# Patient Record
Sex: Female | Born: 1945
Health system: Southern US, Community
[De-identification: ages and names within clinical notes are randomized; demographics above are authoritative.]

## PROBLEM LIST (undated history)

## (undated) DIAGNOSIS — D649 Anemia, unspecified: Secondary | ICD-10-CM

## (undated) DIAGNOSIS — G4730 Sleep apnea, unspecified: Secondary | ICD-10-CM

## (undated) DIAGNOSIS — R062 Wheezing: Secondary | ICD-10-CM

## (undated) DIAGNOSIS — J449 Chronic obstructive pulmonary disease, unspecified: Secondary | ICD-10-CM

## (undated) DIAGNOSIS — I10 Essential (primary) hypertension: Secondary | ICD-10-CM

## (undated) DIAGNOSIS — E119 Type 2 diabetes mellitus without complications: Secondary | ICD-10-CM

## (undated) DIAGNOSIS — F32A Depression, unspecified: Secondary | ICD-10-CM

## (undated) DIAGNOSIS — K219 Gastro-esophageal reflux disease without esophagitis: Secondary | ICD-10-CM

## (undated) DIAGNOSIS — F329 Major depressive disorder, single episode, unspecified: Secondary | ICD-10-CM

## (undated) DIAGNOSIS — R0600 Dyspnea, unspecified: Secondary | ICD-10-CM

## (undated) DIAGNOSIS — R059 Cough, unspecified: Secondary | ICD-10-CM

## (undated) DIAGNOSIS — R05 Cough: Secondary | ICD-10-CM

## (undated) DIAGNOSIS — M1990 Unspecified osteoarthritis, unspecified site: Secondary | ICD-10-CM

## (undated) DIAGNOSIS — R011 Cardiac murmur, unspecified: Secondary | ICD-10-CM

## (undated) DIAGNOSIS — J45909 Unspecified asthma, uncomplicated: Secondary | ICD-10-CM

## (undated) HISTORY — PX: FRACTURE SURGERY: SHX138

## (undated) HISTORY — PX: ABDOMINAL HYSTERECTOMY: SHX81

---

## 1982-08-22 HISTORY — PX: BREAST BIOPSY: SHX20

## 2004-09-13 ENCOUNTER — Ambulatory Visit: Payer: Self-pay | Admitting: Internal Medicine

## 2005-11-14 ENCOUNTER — Ambulatory Visit: Payer: Self-pay | Admitting: Internal Medicine

## 2006-11-16 ENCOUNTER — Ambulatory Visit: Payer: Self-pay | Admitting: Internal Medicine

## 2007-01-30 ENCOUNTER — Ambulatory Visit: Payer: Self-pay | Admitting: Specialist

## 2007-04-04 ENCOUNTER — Ambulatory Visit: Payer: Self-pay | Admitting: Internal Medicine

## 2007-04-23 ENCOUNTER — Ambulatory Visit: Payer: Self-pay | Admitting: Internal Medicine

## 2007-12-11 ENCOUNTER — Ambulatory Visit: Payer: Self-pay | Admitting: Internal Medicine

## 2008-12-11 ENCOUNTER — Ambulatory Visit: Payer: Self-pay | Admitting: Internal Medicine

## 2009-11-16 ENCOUNTER — Ambulatory Visit: Payer: Self-pay | Admitting: Internal Medicine

## 2010-01-07 ENCOUNTER — Ambulatory Visit: Payer: Self-pay | Admitting: Internal Medicine

## 2010-05-28 ENCOUNTER — Emergency Department: Payer: Self-pay | Admitting: Emergency Medicine

## 2011-03-18 ENCOUNTER — Ambulatory Visit: Payer: Self-pay | Admitting: Internal Medicine

## 2012-03-19 ENCOUNTER — Ambulatory Visit: Payer: Self-pay | Admitting: Internal Medicine

## 2012-06-22 ENCOUNTER — Ambulatory Visit: Payer: Self-pay | Admitting: Internal Medicine

## 2012-07-12 ENCOUNTER — Ambulatory Visit: Payer: Self-pay | Admitting: Internal Medicine

## 2013-05-15 ENCOUNTER — Ambulatory Visit: Payer: Self-pay | Admitting: Orthopedic Surgery

## 2013-07-03 ENCOUNTER — Ambulatory Visit: Payer: Self-pay | Admitting: Internal Medicine

## 2014-07-25 ENCOUNTER — Ambulatory Visit: Payer: Self-pay | Admitting: Internal Medicine

## 2014-09-03 ENCOUNTER — Emergency Department: Payer: Self-pay | Admitting: Emergency Medicine

## 2014-09-03 LAB — CBC
HCT: 37.8 % (ref 35.0–47.0)
HGB: 12.1 g/dL (ref 12.0–16.0)
MCH: 28.1 pg (ref 26.0–34.0)
MCHC: 32 g/dL (ref 32.0–36.0)
MCV: 88 fL (ref 80–100)
Platelet: 228 10*3/uL (ref 150–440)
RBC: 4.31 10*6/uL (ref 3.80–5.20)
RDW: 14.6 % — ABNORMAL HIGH (ref 11.5–14.5)
WBC: 8.9 10*3/uL (ref 3.6–11.0)

## 2014-09-03 LAB — BASIC METABOLIC PANEL
Anion Gap: 9 (ref 7–16)
BUN: 19 mg/dL — ABNORMAL HIGH (ref 7–18)
CALCIUM: 8.9 mg/dL (ref 8.5–10.1)
CO2: 27 mmol/L (ref 21–32)
Chloride: 104 mmol/L (ref 98–107)
Creatinine: 0.98 mg/dL (ref 0.60–1.30)
EGFR (African American): >60
EGFR (Non-African Amer.): 60 — ABNORMAL LOW
Glucose: 200 mg/dL — ABNORMAL HIGH (ref 65–99)
Osmolality: 287 (ref 275–301)
Potassium: 4.3 mmol/L (ref 3.5–5.1)
Sodium: 140 mmol/L (ref 136–145)

## 2015-07-24 ENCOUNTER — Other Ambulatory Visit: Payer: Self-pay | Admitting: Internal Medicine

## 2015-07-24 DIAGNOSIS — Z1231 Encounter for screening mammogram for malignant neoplasm of breast: Secondary | ICD-10-CM

## 2015-07-31 ENCOUNTER — Other Ambulatory Visit: Payer: Self-pay | Admitting: Internal Medicine

## 2015-07-31 ENCOUNTER — Ambulatory Visit
Admission: RE | Admit: 2015-07-31 | Discharge: 2015-07-31 | Disposition: A | Discharge status: Home / Self Care | Payer: Medicare Other | Source: Ambulatory Visit | Attending: Internal Medicine | Admitting: Internal Medicine

## 2015-07-31 DIAGNOSIS — Z1231 Encounter for screening mammogram for malignant neoplasm of breast: Secondary | ICD-10-CM | POA: Diagnosis present

## 2016-08-05 ENCOUNTER — Other Ambulatory Visit: Payer: Self-pay | Admitting: Internal Medicine

## 2016-08-05 DIAGNOSIS — Z1231 Encounter for screening mammogram for malignant neoplasm of breast: Secondary | ICD-10-CM

## 2016-08-26 DIAGNOSIS — H2513 Age-related nuclear cataract, bilateral: Secondary | ICD-10-CM | POA: Diagnosis not present

## 2016-09-09 ENCOUNTER — Ambulatory Visit: Payer: No Typology Code available for payment source

## 2016-09-12 DIAGNOSIS — H2513 Age-related nuclear cataract, bilateral: Secondary | ICD-10-CM | POA: Diagnosis not present

## 2016-09-13 ENCOUNTER — Encounter: Payer: Self-pay | Admitting: *Deleted

## 2016-09-15 ENCOUNTER — Ambulatory Visit
Admission: RE | Admit: 2016-09-15 | Discharge: 2016-09-15 | Disposition: A | Discharge status: Home / Self Care | Payer: PPO | Source: Ambulatory Visit | Attending: Ophthalmology | Admitting: Ophthalmology

## 2016-09-15 ENCOUNTER — Encounter
Admission: RE | Disposition: A | Discharge status: Home / Self Care | Payer: Self-pay | Source: Ambulatory Visit | Attending: Ophthalmology

## 2016-09-15 ENCOUNTER — Ambulatory Visit: Payer: PPO | Admitting: Anesthesiology

## 2016-09-15 ENCOUNTER — Encounter: Payer: Self-pay | Admitting: *Deleted

## 2016-09-15 DIAGNOSIS — Z888 Allergy status to other drugs, medicaments and biological substances status: Secondary | ICD-10-CM | POA: Diagnosis not present

## 2016-09-15 DIAGNOSIS — K219 Gastro-esophageal reflux disease without esophagitis: Secondary | ICD-10-CM | POA: Diagnosis not present

## 2016-09-15 DIAGNOSIS — R062 Wheezing: Secondary | ICD-10-CM | POA: Diagnosis not present

## 2016-09-15 DIAGNOSIS — J449 Chronic obstructive pulmonary disease, unspecified: Secondary | ICD-10-CM | POA: Diagnosis not present

## 2016-09-15 DIAGNOSIS — G4730 Sleep apnea, unspecified: Secondary | ICD-10-CM | POA: Diagnosis not present

## 2016-09-15 DIAGNOSIS — H2512 Age-related nuclear cataract, left eye: Secondary | ICD-10-CM | POA: Diagnosis not present

## 2016-09-15 DIAGNOSIS — Z880 Allergy status to penicillin: Secondary | ICD-10-CM | POA: Diagnosis not present

## 2016-09-15 DIAGNOSIS — I10 Essential (primary) hypertension: Secondary | ICD-10-CM | POA: Insufficient documentation

## 2016-09-15 DIAGNOSIS — E7800 Pure hypercholesterolemia, unspecified: Secondary | ICD-10-CM | POA: Insufficient documentation

## 2016-09-15 DIAGNOSIS — H2513 Age-related nuclear cataract, bilateral: Secondary | ICD-10-CM | POA: Diagnosis not present

## 2016-09-15 DIAGNOSIS — E1136 Type 2 diabetes mellitus with diabetic cataract: Secondary | ICD-10-CM | POA: Diagnosis not present

## 2016-09-15 DIAGNOSIS — Z882 Allergy status to sulfonamides status: Secondary | ICD-10-CM | POA: Insufficient documentation

## 2016-09-15 DIAGNOSIS — Z90710 Acquired absence of both cervix and uterus: Secondary | ICD-10-CM | POA: Diagnosis not present

## 2016-09-15 DIAGNOSIS — R05 Cough: Secondary | ICD-10-CM | POA: Diagnosis not present

## 2016-09-15 DIAGNOSIS — R011 Cardiac murmur, unspecified: Secondary | ICD-10-CM | POA: Diagnosis not present

## 2016-09-15 HISTORY — DX: Type 2 diabetes mellitus without complications: E11.9

## 2016-09-15 HISTORY — DX: Essential (primary) hypertension: I10

## 2016-09-15 HISTORY — DX: Depression, unspecified: F32.A

## 2016-09-15 HISTORY — DX: Major depressive disorder, single episode, unspecified: F32.9

## 2016-09-15 HISTORY — DX: Chronic obstructive pulmonary disease, unspecified: J44.9

## 2016-09-15 HISTORY — DX: Anemia, unspecified: D64.9

## 2016-09-15 HISTORY — DX: Sleep apnea, unspecified: G47.30

## 2016-09-15 HISTORY — DX: Gastro-esophageal reflux disease without esophagitis: K21.9

## 2016-09-15 HISTORY — DX: Unspecified asthma, uncomplicated: J45.909

## 2016-09-15 HISTORY — PX: CATARACT EXTRACTION W/PHACO: SHX586

## 2016-09-15 HISTORY — DX: Cardiac murmur, unspecified: R01.1

## 2016-09-15 HISTORY — DX: Dyspnea, unspecified: R06.00

## 2016-09-15 HISTORY — DX: Unspecified osteoarthritis, unspecified site: M19.90

## 2016-09-15 LAB — GLUCOSE, CAPILLARY: GLUCOSE-CAPILLARY: 166 mg/dL — AB (ref 65–99)

## 2016-09-15 SURGERY — PHACOEMULSIFICATION, CATARACT, WITH IOL INSERTION
Anesthesia: Monitor Anesthesia Care | Site: Eye | Laterality: Left | Wound class: Clean

## 2016-09-15 MED ORDER — MOXIFLOXACIN HCL 0.5 % OP SOLN
OPHTHALMIC | Status: AC
  Filled 2016-09-15: qty 3

## 2016-09-15 MED ORDER — ARMC OPHTHALMIC DILATING DROPS
1.0000 "application " | OPHTHALMIC | Status: AC
  Administered 2016-09-15 (×2): 1 via OPHTHALMIC

## 2016-09-15 MED ORDER — SODIUM HYALURONATE 23 MG/ML IO SOLN
INTRAOCULAR | Status: AC
  Filled 2016-09-15: qty 0.6

## 2016-09-15 MED ORDER — SODIUM HYALURONATE 10 MG/ML IO SOLN
INTRAOCULAR | Status: DC | PRN
  Administered 2016-09-15: 0.85 mL via INTRAOCULAR

## 2016-09-15 MED ORDER — LIDOCAINE HCL (PF) 4 % IJ SOLN
INTRAMUSCULAR | Status: AC
  Filled 2016-09-15: qty 5

## 2016-09-15 MED ORDER — SODIUM HYALURONATE 10 MG/ML IO SOLN
INTRAOCULAR | Status: AC
  Filled 2016-09-15: qty 0.85

## 2016-09-15 MED ORDER — ARMC OPHTHALMIC DILATING DROPS
OPHTHALMIC | Status: AC
  Administered 2016-09-15: 06:00:00
  Filled 2016-09-15: qty 0.4

## 2016-09-15 MED ORDER — EPINEPHRINE PF 1 MG/ML IJ SOLN
INTRAMUSCULAR | Status: AC
  Filled 2016-09-15: qty 1

## 2016-09-15 MED ORDER — SODIUM HYALURONATE 23 MG/ML IO SOLN
INTRAOCULAR | Status: DC | PRN
  Administered 2016-09-15: 0.6 mL via INTRAOCULAR

## 2016-09-15 MED ORDER — FENTANYL CITRATE (PF) 100 MCG/2ML IJ SOLN
INTRAMUSCULAR | Status: AC
Start: 2016-09-15 — End: 2016-09-15
  Filled 2016-09-15: qty 2

## 2016-09-15 MED ORDER — SODIUM CHLORIDE 0.9 % IV SOLN
INTRAVENOUS | Status: DC
  Administered 2016-09-15: 07:00:00 via INTRAVENOUS

## 2016-09-15 MED ORDER — MOXIFLOXACIN HCL 0.5 % OP SOLN: 1.0000 [drp] | OPHTHALMIC | Status: DC | PRN

## 2016-09-15 MED ORDER — BSS IO SOLN
INTRAOCULAR | Status: DC | PRN
  Administered 2016-09-15: 200 mL via INTRAOCULAR

## 2016-09-15 MED ORDER — MOXIFLOXACIN HCL 0.5 % OP SOLN
OPHTHALMIC | Status: DC | PRN
  Administered 2016-09-15: 0.2 mL via OPHTHALMIC

## 2016-09-15 MED ORDER — POVIDONE-IODINE 5 % OP SOLN
OPHTHALMIC | Status: AC
  Filled 2016-09-15: qty 30

## 2016-09-15 MED ORDER — FENTANYL CITRATE (PF) 100 MCG/2ML IJ SOLN
INTRAMUSCULAR | Status: DC | PRN
  Administered 2016-09-15: 25 ug via INTRAVENOUS
  Administered 2016-09-15: 50 ug via INTRAVENOUS
  Administered 2016-09-15: 25 ug via INTRAVENOUS

## 2016-09-15 MED ORDER — LIDOCAINE HCL (PF) 4 % IJ SOLN
INTRAMUSCULAR | Status: DC | PRN
  Administered 2016-09-15: 4 mL via OPHTHALMIC

## 2016-09-15 SURGICAL SUPPLY — 20 items
CANNULA ANT/CHMB 27GA (MISCELLANEOUS) ×4 IMPLANT
CUP MEDICINE 2OZ PLAST GRAD ST (MISCELLANEOUS) ×2 IMPLANT
DISSECTOR HYDRO NUCLEUS 50X22 (MISCELLANEOUS) ×2 IMPLANT
GLOVE BIO SURGEON STRL SZ8 (GLOVE) ×2 IMPLANT
GLOVE BIOGEL M 6.5 STRL (GLOVE) ×2 IMPLANT
GLOVE SURG LX 7.5 STRW (GLOVE) ×1
GLOVE SURG LX STRL 7.5 STRW (GLOVE) ×1 IMPLANT
GOWN STRL REUS W/ TWL LRG LVL3 (GOWN DISPOSABLE) ×2 IMPLANT
GOWN STRL REUS W/TWL LRG LVL3 (GOWN DISPOSABLE) ×2
LENS IOL TECNIS ITEC 19.5 (Intraocular Lens) ×2 IMPLANT
PACK CATARACT (MISCELLANEOUS) ×2 IMPLANT
PACK CATARACT KING (MISCELLANEOUS) ×2 IMPLANT
PACK EYE AFTER SURG (MISCELLANEOUS) ×2 IMPLANT
SOL BSS BAG (MISCELLANEOUS) ×2
SOLUTION BSS BAG (MISCELLANEOUS) ×1 IMPLANT
SYR 3ML LL SCALE MARK (SYRINGE) ×4 IMPLANT
SYR 5ML LL (SYRINGE) ×2 IMPLANT
SYR TB 1ML 27GX1/2 LL (SYRINGE) ×2 IMPLANT
WATER STERILE IRR 250ML POUR (IV SOLUTION) ×2 IMPLANT
WIPE NON LINTING 3.25X3.25 (MISCELLANEOUS) ×2 IMPLANT

## 2016-09-15 NOTE — OR Nursing (Signed)
vigamox to OR with patient

## 2016-09-15 NOTE — OR Nursing (Signed)
Pt belongings taken to postop room 1

## 2016-09-15 NOTE — Anesthesia Post-op Follow-up Note (Cosign Needed)
Anesthesia QCDR form completed.        

## 2016-09-15 NOTE — Anesthesia Postprocedure Evaluation (Signed)
Anesthesia Post Note  Patient: Isabella White  Procedure(s) Performed: Procedure(s) (LRB): CATARACT EXTRACTION PHACO AND INTRAOCULAR LENS PLACEMENT (IOC) (Left)  Patient location during evaluation: Short Stay Anesthesia Type: MAC Level of consciousness: awake, awake and alert and oriented Pain management: pain level controlled Vital Signs Assessment: post-procedure vital signs reviewed and stable Respiratory status: spontaneous breathing Cardiovascular status: stable Postop Assessment: no headache Anesthetic complications: no     Last Vitals:  Vitals:   09/15/16 0810 09/15/16 0812  BP: (!) 128/93 (!) 128/93  Pulse: 72 78  Resp:  20  Temp: 36.9 C     Last Pain:  Vitals:   09/15/16 0603  TempSrc: Burnett Corrente

## 2016-09-15 NOTE — Discharge Instructions (Signed)
Eye Surgery Discharge Instructions  Expect mild scratchy sensation or mild soreness. DO NOT RUB YOUR EYE!  The day of surgery:  Minimal physical activity, but bed rest is not required  No reading, computer work, or close hand work  No bending, lifting, or straining.  May watch TV  For 24 hours:  No driving, legal decisions, or alcoholic beverages  Safety precautions  Eat anything you prefer: It is better to start with liquids, then soup then solid foods.  _____ Eye patch should be worn until postoperative exam tomorrow.  ____ Solar shield eyeglasses should be worn for comfort in the sunlight/patch while sleeping  Resume all regular medications including aspirin or Coumadin if these were discontinued prior to surgery. You may shower, bathe, shave, or wash your hair. Tylenol may be taken for mild discomfort.  Call your doctor if you experience significant pain, nausea, or vomiting, fever > 101 or other signs of infection. [phone] or [phone] Specific instructions:  Follow-up Information    Willey BladeBradley King, MD Follow up.   Specialty:  Ophthalmology Why:  January 26 at 10:00am Contact information: [address] [place] [address] [phone]

## 2016-09-15 NOTE — Op Note (Signed)
OPERATIVE NOTE  Isabella White [id number] 09/15/2016   PREOPERATIVE DIAGNOSIS:  Nuclear sclerotic cataract left eye.  H25.12   POSTOPERATIVE DIAGNOSIS:    Nuclear sclerotic cataract left eye.     PROCEDURE:  Phacoemusification with posterior chamber intraocular lens placement of the left eye   LENS:   Implant Name Type Inv. Item Serial No. Manufacturer Lot No. LRB No. Used  LENS IOL DIOP 19.5 - [id number] Intraocular Lens LENS IOL DIOP 19.5 [id number] AMO   Left 1       PCB00 +19.5   ULTRASOUND TIME: 0 minutes 48.9 seconds.  CDE 7.39   SURGEON:  Willey BladeBradley King, MD, MPH   ANESTHESIA:  Topical with tetracaine drops augmented with 1% preservative-free intracameral lidocaine.  ESTIMATED BLOOD LOSS: <1 mL   COMPLICATIONS:  None.   DESCRIPTION OF PROCEDURE:  The patient was identified in the holding room and transported to the operating room and placed in the supine position under the operating microscope.  The left eye was identified as the operative eye and it was prepped and draped in the usual sterile ophthalmic fashion.   A 1.0 millimeter clear-corneal paracentesis was made at the 5:00 position. 0.5 ml of preservative-free 1% lidocaine with epinephrine was injected into the anterior chamber.  The anterior chamber was filled with Healon 5 viscoelastic.  A 2.4 millimeter keratome was used to make a near-clear corneal incision at the 2:00 position.  A curvilinear capsulorrhexis was made with a cystotome and capsulorrhexis forceps.  Balanced salt solution was used to hydrodissect and hydrodelineate the nucleus.   Phacoemulsification was then used in stop and chop fashion to remove the lens nucleus and epinucleus.  The remaining cortex was then removed using the irrigation and aspiration handpiece. Healon was then placed into the capsular bag to distend it for lens placement.  A lens was then injected into the capsular bag.  The remaining viscoelastic was aspirated.   Wounds were  hydrated with balanced salt solution.  A small lens fragment presented from the subincisional paracentesis site, so the I/A was placed back into the eye to aspirate it.  The anterior chamber was inflated to a physiologic pressure with balanced salt solution.  Intracameral vigamox 0.1 mL undiltued was injected into the eye and a drop placed onto the ocular surface.  No wound leaks were noted.  The patient was taken to the recovery room in stable condition without complications of anesthesia or surgery  Willey BladeBradley King 09/15/2016, 8:09 AM

## 2016-09-15 NOTE — H&P (Signed)
The History and Physical notes are on paper, have been signed, and are to be scanned. The patient remains stable and unchanged from the H&P.   Previous H&P reviewed, patient examined, and there are no changes.  Isabella BladeBradley White 09/15/2016 7:29 AM

## 2016-09-15 NOTE — Anesthesia Preprocedure Evaluation (Signed)
Anesthesia Evaluation  Patient identified by MRN, date of birth, ID band Patient awake    Reviewed: Allergy & Precautions, H&P , NPO status , Patient's Chart, lab work & pertinent test results  History of Anesthesia Complications Negative for: history of anesthetic complications  Airway Mallampati: III  TM Distance: <3 FB Neck ROM: limited    Dental  (+) Poor Dentition, Chipped   Pulmonary shortness of breath and with exertion, asthma , sleep apnea , COPD,    Pulmonary exam normal breath sounds clear to auscultation       Cardiovascular Exercise Tolerance: Poor hypertension, (-) angina(-) Past MI and (-) DOE Normal cardiovascular exam+ Valvular Problems/Murmurs  Rhythm:regular Rate:Normal     Neuro/Psych PSYCHIATRIC DISORDERS negative neurological ROS     GI/Hepatic Neg liver ROS, GERD  Controlled,  Endo/Other  diabetes, Type 2  Renal/GU      Musculoskeletal  (+) Arthritis ,   Abdominal   Peds  Hematology negative hematology ROS (+)   Anesthesia Other Findings Past Medical History: No date: Anemia No date: Arthritis No date: Asthma No date: COPD (chronic obstructive pulmonary disease) (* No date: Depression No date: Diabetes mellitus without complication (HCC) No date: Dyspnea No date: GERD (gastroesophageal reflux disease) No date: Heart murmur No date: Hypertension No date: Sleep apnea     Comment: CPAP  Past Surgical History: No date: ABDOMINAL HYSTERECTOMY 1984: BREAST BIOPSY Right No date: FRACTURE SURGERY     Comment: ORIF ANKLE  BMI    Body Mass Index:  33.63 kg/m      Reproductive/Obstetrics negative OB ROS                             Anesthesia Physical Anesthesia Plan  ASA: III  Anesthesia Plan: MAC   Post-op Pain Management:    Induction:   Airway Management Planned:   Additional Equipment:   Intra-op Plan:   Post-operative Plan:   Informed  Consent: I have reviewed the patients History and Physical, chart, labs and discussed the procedure including the risks, benefits and alternatives for the proposed anesthesia with the patient or authorized representative who has indicated his/her understanding and acceptance.     Plan Discussed with: Anesthesiologist, CRNA and Surgeon  Anesthesia Plan Comments:         Anesthesia Quick Evaluation

## 2016-09-15 NOTE — Transfer of Care (Signed)
Immediate Anesthesia Transfer of Care Note  Patient: Isabella White  Procedure(s) Performed: Procedure(s) with comments: CATARACT EXTRACTION PHACO AND INTRAOCULAR LENS PLACEMENT (IOC) (Left) - Lot# [id number] H [place]: 00:48.9 AP%: 15.1 CDE: 7.39  Patient Location: PACU and Short Stay  Anesthesia Type:MAC  Level of Consciousness: awake, alert  and oriented  Airway & Oxygen Therapy: Patient Spontanous Breathing  Post-op Assessment: Report given to RN and Post -op Vital signs reviewed and stable  Post vital signs: Reviewed and stable  Last Vitals:  Vitals:   09/15/16 0603 09/15/16 0812  BP: (!) 149/64 (!) 128/93  Pulse: 73 78  Resp: 16 20  Temp: 36.7 C     Last Pain:  Vitals:   09/15/16 0603  TempSrc: Oral         Complications: No apparent anesthesia complications

## 2016-09-19 DIAGNOSIS — J4520 Mild intermittent asthma, uncomplicated: Secondary | ICD-10-CM | POA: Diagnosis not present

## 2016-09-19 DIAGNOSIS — E538 Deficiency of other specified B group vitamins: Secondary | ICD-10-CM | POA: Diagnosis not present

## 2016-09-19 DIAGNOSIS — G4733 Obstructive sleep apnea (adult) (pediatric): Secondary | ICD-10-CM | POA: Diagnosis not present

## 2016-09-29 DIAGNOSIS — E669 Obesity, unspecified: Secondary | ICD-10-CM | POA: Diagnosis not present

## 2016-09-29 DIAGNOSIS — E785 Hyperlipidemia, unspecified: Secondary | ICD-10-CM | POA: Diagnosis not present

## 2016-09-29 DIAGNOSIS — Z6833 Body mass index (BMI) 33.0-33.9, adult: Secondary | ICD-10-CM | POA: Diagnosis not present

## 2016-09-29 DIAGNOSIS — E538 Deficiency of other specified B group vitamins: Secondary | ICD-10-CM | POA: Diagnosis not present

## 2016-09-29 DIAGNOSIS — E1165 Type 2 diabetes mellitus with hyperglycemia: Secondary | ICD-10-CM | POA: Diagnosis not present

## 2016-10-06 ENCOUNTER — Other Ambulatory Visit: Payer: Self-pay | Admitting: Internal Medicine

## 2016-10-06 ENCOUNTER — Ambulatory Visit
Admission: RE | Admit: 2016-10-06 | Discharge: 2016-10-06 | Disposition: A | Discharge status: Home / Self Care | Payer: PPO | Source: Ambulatory Visit | Attending: Internal Medicine | Admitting: Internal Medicine

## 2016-10-06 DIAGNOSIS — Z1231 Encounter for screening mammogram for malignant neoplasm of breast: Secondary | ICD-10-CM

## 2016-10-21 DIAGNOSIS — E538 Deficiency of other specified B group vitamins: Secondary | ICD-10-CM | POA: Diagnosis not present

## 2016-11-01 DIAGNOSIS — H2511 Age-related nuclear cataract, right eye: Secondary | ICD-10-CM | POA: Diagnosis not present

## 2016-11-08 ENCOUNTER — Encounter: Payer: Self-pay | Admitting: *Deleted

## 2016-11-10 ENCOUNTER — Ambulatory Visit: Payer: PPO | Admitting: Anesthesiology

## 2016-11-10 ENCOUNTER — Ambulatory Visit
Admission: RE | Admit: 2016-11-10 | Discharge: 2016-11-10 | Disposition: A | Discharge status: Home / Self Care | Payer: PPO | Source: Ambulatory Visit | Attending: Ophthalmology | Admitting: Ophthalmology

## 2016-11-10 ENCOUNTER — Encounter: Payer: Self-pay | Admitting: *Deleted

## 2016-11-10 ENCOUNTER — Encounter
Admission: RE | Disposition: A | Discharge status: Home / Self Care | Payer: Self-pay | Source: Ambulatory Visit | Attending: Ophthalmology

## 2016-11-10 DIAGNOSIS — K219 Gastro-esophageal reflux disease without esophagitis: Secondary | ICD-10-CM | POA: Insufficient documentation

## 2016-11-10 DIAGNOSIS — F329 Major depressive disorder, single episode, unspecified: Secondary | ICD-10-CM | POA: Insufficient documentation

## 2016-11-10 DIAGNOSIS — J449 Chronic obstructive pulmonary disease, unspecified: Secondary | ICD-10-CM | POA: Diagnosis not present

## 2016-11-10 DIAGNOSIS — Z6833 Body mass index (BMI) 33.0-33.9, adult: Secondary | ICD-10-CM | POA: Insufficient documentation

## 2016-11-10 DIAGNOSIS — E669 Obesity, unspecified: Secondary | ICD-10-CM | POA: Diagnosis not present

## 2016-11-10 DIAGNOSIS — G4730 Sleep apnea, unspecified: Secondary | ICD-10-CM | POA: Insufficient documentation

## 2016-11-10 DIAGNOSIS — Z79899 Other long term (current) drug therapy: Secondary | ICD-10-CM | POA: Insufficient documentation

## 2016-11-10 DIAGNOSIS — D649 Anemia, unspecified: Secondary | ICD-10-CM | POA: Diagnosis not present

## 2016-11-10 DIAGNOSIS — E1136 Type 2 diabetes mellitus with diabetic cataract: Secondary | ICD-10-CM | POA: Diagnosis not present

## 2016-11-10 DIAGNOSIS — I10 Essential (primary) hypertension: Secondary | ICD-10-CM | POA: Insufficient documentation

## 2016-11-10 DIAGNOSIS — Z794 Long term (current) use of insulin: Secondary | ICD-10-CM | POA: Insufficient documentation

## 2016-11-10 DIAGNOSIS — M1990 Unspecified osteoarthritis, unspecified site: Secondary | ICD-10-CM | POA: Diagnosis not present

## 2016-11-10 DIAGNOSIS — H2511 Age-related nuclear cataract, right eye: Secondary | ICD-10-CM | POA: Diagnosis not present

## 2016-11-10 HISTORY — PX: CATARACT EXTRACTION W/PHACO: SHX586

## 2016-11-10 HISTORY — DX: Wheezing: R06.2

## 2016-11-10 HISTORY — DX: Cough: R05

## 2016-11-10 HISTORY — DX: Cough, unspecified: R05.9

## 2016-11-10 LAB — GLUCOSE, CAPILLARY: Glucose-Capillary: 200 mg/dL — ABNORMAL HIGH (ref 65–99)

## 2016-11-10 SURGERY — PHACOEMULSIFICATION, CATARACT, WITH IOL INSERTION
Anesthesia: Monitor Anesthesia Care | Site: Eye | Laterality: Right | Wound class: Clean

## 2016-11-10 MED ORDER — MOXIFLOXACIN HCL 0.5 % OP SOLN: 1.0000 [drp] | OPHTHALMIC | Status: DC | PRN

## 2016-11-10 MED ORDER — CARBACHOL 0.01 % IO SOLN
INTRAOCULAR | Status: DC | PRN
  Administered 2016-11-10: 0.5 mL via INTRAOCULAR

## 2016-11-10 MED ORDER — SODIUM HYALURONATE 10 MG/ML IO SOLN
INTRAOCULAR | Status: AC
  Filled 2016-11-10: qty 0.85

## 2016-11-10 MED ORDER — SODIUM HYALURONATE 23 MG/ML IO SOLN
INTRAOCULAR | Status: AC
  Filled 2016-11-10: qty 0.6

## 2016-11-10 MED ORDER — MOXIFLOXACIN HCL 0.5 % OP SOLN
OPHTHALMIC | Status: DC | PRN
  Administered 2016-11-10: 0.2 mL via OPHTHALMIC

## 2016-11-10 MED ORDER — MIDAZOLAM HCL 2 MG/2ML IJ SOLN
INTRAMUSCULAR | Status: AC
  Filled 2016-11-10: qty 2

## 2016-11-10 MED ORDER — EPINEPHRINE PF 1 MG/ML IJ SOLN
INTRAMUSCULAR | Status: AC
  Filled 2016-11-10: qty 2

## 2016-11-10 MED ORDER — MOXIFLOXACIN HCL 0.5 % OP SOLN
OPHTHALMIC | Status: AC
  Filled 2016-11-10: qty 3

## 2016-11-10 MED ORDER — POVIDONE-IODINE 5 % OP SOLN
OPHTHALMIC | Status: DC | PRN
  Administered 2016-11-10: 1 via OPHTHALMIC

## 2016-11-10 MED ORDER — POVIDONE-IODINE 5 % OP SOLN
OPHTHALMIC | Status: AC
  Filled 2016-11-10: qty 30

## 2016-11-10 MED ORDER — ARMC OPHTHALMIC DILATING DROPS
OPHTHALMIC | Status: AC
  Filled 2016-11-10: qty 0.4

## 2016-11-10 MED ORDER — LIDOCAINE HCL (PF) 4 % IJ SOLN
INTRAOCULAR | Status: DC | PRN
  Administered 2016-11-10: 4 mL via OPHTHALMIC

## 2016-11-10 MED ORDER — LIDOCAINE HCL (PF) 2 % IJ SOLN
INTRAMUSCULAR | Status: AC
  Filled 2016-11-10: qty 2

## 2016-11-10 MED ORDER — SODIUM HYALURONATE 23 MG/ML IO SOLN
INTRAOCULAR | Status: DC | PRN
  Administered 2016-11-10: 0.6 mL via INTRAOCULAR

## 2016-11-10 MED ORDER — MIDAZOLAM HCL 2 MG/2ML IJ SOLN
INTRAMUSCULAR | Status: DC | PRN
  Administered 2016-11-10 (×2): 0.5 mg via INTRAVENOUS

## 2016-11-10 MED ORDER — SODIUM HYALURONATE 10 MG/ML IO SOLN
INTRAOCULAR | Status: DC | PRN
  Administered 2016-11-10: 0.85 mL via INTRAOCULAR

## 2016-11-10 MED ORDER — SODIUM CHLORIDE 0.9 % IV SOLN
INTRAVENOUS | Status: DC
  Administered 2016-11-10 (×2): via INTRAVENOUS

## 2016-11-10 MED ORDER — ALFENTANIL 500 MCG/ML IJ INJ
INJECTION | INTRAMUSCULAR | Status: DC | PRN
  Administered 2016-11-10 (×2): 250 ug via INTRAVENOUS

## 2016-11-10 MED ORDER — ARMC OPHTHALMIC DILATING DROPS
1.0000 "application " | OPHTHALMIC | Status: AC
  Administered 2016-11-10 (×3): 1 via OPHTHALMIC

## 2016-11-10 MED ORDER — EPINEPHRINE PF 1 MG/ML IJ SOLN
INTRAOCULAR | Status: DC | PRN
  Administered 2016-11-10: 08:00:00 via OPHTHALMIC

## 2016-11-10 SURGICAL SUPPLY — 20 items
CANNULA ANT/CHMB 27GA (MISCELLANEOUS) ×4 IMPLANT
CUP MEDICINE 2OZ PLAST GRAD ST (MISCELLANEOUS) ×2 IMPLANT
DISSECTOR HYDRO NUCLEUS 50X22 (MISCELLANEOUS) ×2 IMPLANT
GLOVE BIO SURGEON STRL SZ8 (GLOVE) ×2 IMPLANT
GLOVE BIOGEL M 6.5 STRL (GLOVE) ×2 IMPLANT
GLOVE SURG LX 7.5 STRW (GLOVE) ×1
GLOVE SURG LX STRL 7.5 STRW (GLOVE) ×1 IMPLANT
GOWN STRL REUS W/ TWL LRG LVL3 (GOWN DISPOSABLE) ×2 IMPLANT
GOWN STRL REUS W/TWL LRG LVL3 (GOWN DISPOSABLE) ×2
LENS IOL TECNIS ITEC 19.5 (Intraocular Lens) ×2 IMPLANT
PACK CATARACT (MISCELLANEOUS) ×2 IMPLANT
PACK CATARACT KING (MISCELLANEOUS) ×2 IMPLANT
PACK EYE AFTER SURG (MISCELLANEOUS) ×2 IMPLANT
SOL BSS BAG (MISCELLANEOUS) ×2
SOLUTION BSS BAG (MISCELLANEOUS) ×1 IMPLANT
SYR 3ML LL SCALE MARK (SYRINGE) ×4 IMPLANT
SYR 5ML LL (SYRINGE) ×2 IMPLANT
SYR TB 1ML 27GX1/2 LL (SYRINGE) ×2 IMPLANT
WATER STERILE IRR 250ML POUR (IV SOLUTION) ×2 IMPLANT
WIPE NON LINTING 3.25X3.25 (MISCELLANEOUS) ×2 IMPLANT

## 2016-11-10 NOTE — H&P (Signed)
The History and Physical notes are on paper, have been signed, and are to be scanned.   I have examined the patient and there are no changes to the H&P.   Willey BladeBradley Kollyn Lingafelter 11/10/2016 8:15 AM

## 2016-11-10 NOTE — Anesthesia Postprocedure Evaluation (Signed)
Anesthesia Post Note  Patient: Isabella White  Procedure(s) Performed: Procedure(s) (LRB): CATARACT EXTRACTION PHACO AND INTRAOCULAR LENS PLACEMENT (IOC) (Right)  Patient location during evaluation: PACU Anesthesia Type: MAC Level of consciousness: awake and alert and oriented Pain management: pain level controlled Vital Signs Assessment: post-procedure vital signs reviewed and stable Respiratory status: spontaneous breathing, nonlabored ventilation and respiratory function stable Cardiovascular status: blood pressure returned to baseline and stable Postop Assessment: no signs of nausea or vomiting Anesthetic complications: no     Last Vitals:  Vitals:   11/10/16 0854 11/10/16 0856  BP: 123/68 123/68  Pulse: 73 72  Resp: 15 16  Temp: 36.9 C 36.9 C    Last Pain:  Vitals:   11/10/16 0655  TempSrc: Oral                 Ludy Messamore

## 2016-11-10 NOTE — Transfer of Care (Signed)
Immediate Anesthesia Transfer of Care Note  Patient: Fingal  Procedure(s) Performed: Procedure(s) with comments: CATARACT EXTRACTION PHACO AND INTRAOCULAR LENS PLACEMENT (IOC) (Right) - [place] 56.6 AP% 10.8 CDE 6.39 Fluid Pack lot # [id number]  Patient Location: PACU and Short Stay  Anesthesia Type:MAC  Level of Consciousness: awake, oriented and patient cooperative  Airway & Oxygen Therapy: Patient Spontanous Breathing  Post-op Assessment: Report given to RN and Post -op Vital signs reviewed and stable  Post vital signs: Reviewed and stable  Last Vitals:  Vitals:   11/10/16 0655  BP: (!) 166/82  Pulse: 88  Resp: 18  Temp: 36.7 C    Last Pain:  Vitals:   11/10/16 0655  TempSrc: Oral         Complications: No apparent anesthesia complications

## 2016-11-10 NOTE — Anesthesia Preprocedure Evaluation (Signed)
Anesthesia Evaluation  Patient identified by MRN, date of birth, ID band Patient awake    Reviewed: Allergy & Precautions, H&P , NPO status , Patient's Chart, lab work & pertinent test results  History of Anesthesia Complications Negative for: history of anesthetic complications  Airway Mallampati: III  TM Distance: <3 FB Neck ROM: limited    Dental  (+) Poor Dentition, Chipped   Pulmonary shortness of breath and with exertion, asthma , sleep apnea , COPD,    Pulmonary exam normal breath sounds clear to auscultation- rhonchi (-) wheezing      Cardiovascular Exercise Tolerance: Poor hypertension, Pt. on medications (-) angina(-) Past MI and (-) DOE Normal cardiovascular exam+ Valvular Problems/Murmurs  Rhythm:regular Rate:Normal - Systolic murmurs and - Diastolic murmurs    Neuro/Psych PSYCHIATRIC DISORDERS Depression negative neurological ROS     GI/Hepatic Neg liver ROS, GERD  Controlled,  Endo/Other  diabetes, Type 2, Insulin Dependent  Renal/GU negative Renal ROS     Musculoskeletal  (+) Arthritis ,   Abdominal (+) + obese,   Peds  Hematology  (+) anemia ,   Anesthesia Other Findings Past Medical History: No date: Anemia No date: Arthritis No date: Asthma No date: COPD (chronic obstructive pulmonary disease) (* No date: Depression No date: Diabetes mellitus without complication (HCC) No date: Dyspnea No date: GERD (gastroesophageal reflux disease) No date: Heart murmur No date: Hypertension No date: Sleep apnea     Comment: CPAP  Past Surgical History: No date: ABDOMINAL HYSTERECTOMY 1984: BREAST BIOPSY Right No date: FRACTURE SURGERY     Comment: ORIF ANKLE  BMI    Body Mass Index:  33.63 kg/m      Reproductive/Obstetrics negative OB ROS                             Anesthesia Physical  Anesthesia Plan  ASA: III  Anesthesia Plan: MAC   Post-op Pain Management:     Induction:   Airway Management Planned: Natural Airway  Additional Equipment:   Intra-op Plan:   Post-operative Plan:   Informed Consent: I have reviewed the patients History and Physical, chart, labs and discussed the procedure including the risks, benefits and alternatives for the proposed anesthesia with the patient or authorized representative who has indicated his/her understanding and acceptance.     Plan Discussed with: Anesthesiologist, CRNA and Surgeon  Anesthesia Plan Comments:         Anesthesia Quick Evaluation

## 2016-11-10 NOTE — Discharge Instructions (Signed)
Eye Surgery Discharge Instructions  Expect mild scratchy sensation or mild soreness. DO NOT RUB YOUR EYE!  The day of surgery:  Minimal physical activity, but bed rest is not required  No reading, computer work, or close hand work  No bending, lifting, or straining.  May watch TV  For 24 hours:  No driving, legal decisions, or alcoholic beverages  Safety precautions  Eat anything you prefer: It is better to start with liquids, then soup then solid foods.  _____ Eye patch should be worn until postoperative exam tomorrow.  ____ Solar shield eyeglasses should be worn for comfort in the sunlight/patch while sleeping  Resume all regular medications including aspirin or Coumadin if these were discontinued prior to surgery. You may shower, bathe, shave, or wash your hair. Tylenol may be taken for mild discomfort.  Call your doctor if you experience significant pain, nausea, or vomiting, fever > 101 or other signs of infection. [phone] or [phone] Specific instructions:  Follow-up Information    Willey BladeBradley King, MD Follow up.   Specialty:  Ophthalmology Why:  March 23 at 9:30am Contact information: [address] [place] [address] [phone]

## 2016-11-10 NOTE — Op Note (Signed)
OPERATIVE NOTE  Isabella CoveMary A White [id number] 11/10/2016   PREOPERATIVE DIAGNOSIS:  Nuclear sclerotic cataract right eye.  H25.11   POSTOPERATIVE DIAGNOSIS:    Nuclear sclerotic cataract right eye.     PROCEDURE:  Phacoemusification with posterior chamber intraocular lens placement of the right eye   LENS:   Implant Name Type Inv. Item Serial No. Manufacturer Lot No. LRB No. Used  LENS IOL DIOP 19.5 - [id number] Intraocular Lens LENS IOL DIOP 19.5 [id number]   Right 1       PCB00 +19.5   ULTRASOUND TIME: 0 minutes 56.6 seconds.  CDE 6.39   SURGEON:  Willey BladeBradley King, MD, MPH  ANESTHESIOLOGIST: Anesthesiologist: Alver FisherAmy Penwarden, MD CRNA: Charna Busmanhomas Diamond, CRNA   ANESTHESIA:  Topical with tetracaine drops augmented with 1% preservative-free intracameral lidocaine.  ESTIMATED BLOOD LOSS: less than 1 mL.   COMPLICATIONS:  None.   DESCRIPTION OF PROCEDURE:  The patient was identified in the holding room and transported to the operating room and placed in the supine position under the operating microscope.  The right eye was identified as the operative eye and it was prepped and draped in the usual sterile ophthalmic fashion.   A 1.0 millimeter clear-corneal paracentesis was made at the 10:30 position. 0.5 ml of preservative-free 1% lidocaine with epinephrine was injected into the anterior chamber.  The anterior chamber was filled with Healon 5 viscoelastic.  A 2.4 millimeter keratome was used to make a near-clear corneal incision at the 8:00 position.  A curvilinear capsulorrhexis was made with a cystotome and capsulorrhexis forceps.  Balanced salt solution was used to hydrodissect and hydrodelineate the nucleus.   Phacoemulsification was then used in stop and chop fashion to remove the lens nucleus and epinucleus.  The remaining cortex was then removed using the irrigation and aspiration handpiece. Healon was then placed into the capsular bag to distend it for lens placement.  A lens was  then injected into the capsular bag.  The remaining viscoelastic was aspirated.   Wounds were hydrated with balanced salt solution.  The anterior chamber was inflated to a physiologic pressure with balanced salt solution.   Intracameral vigamox 0.1 mL undiluted was injected into the eye and a drop placed onto the ocular surface.  No wound leaks were noted.  The patient was taken to the recovery room in stable condition without complications of anesthesia or surgery  Willey BladeBradley King 11/10/2016, 8:53 AM

## 2016-11-10 NOTE — Anesthesia Post-op Follow-up Note (Cosign Needed)
Anesthesia QCDR form completed.        

## 2016-11-25 DIAGNOSIS — E538 Deficiency of other specified B group vitamins: Secondary | ICD-10-CM | POA: Diagnosis not present

## 2016-12-19 DIAGNOSIS — Z961 Presence of intraocular lens: Secondary | ICD-10-CM | POA: Diagnosis not present

## 2016-12-30 DIAGNOSIS — E538 Deficiency of other specified B group vitamins: Secondary | ICD-10-CM | POA: Diagnosis not present

## 2017-02-03 DIAGNOSIS — E538 Deficiency of other specified B group vitamins: Secondary | ICD-10-CM | POA: Diagnosis not present

## 2017-03-17 DIAGNOSIS — E538 Deficiency of other specified B group vitamins: Secondary | ICD-10-CM | POA: Diagnosis not present

## 2017-03-26 ENCOUNTER — Emergency Department: Payer: PPO

## 2017-03-26 ENCOUNTER — Emergency Department
Admission: EM | Admit: 2017-03-26 | Discharge: 2017-03-26 | Disposition: A | Discharge status: Home / Self Care | Payer: PPO | Attending: Emergency Medicine | Admitting: Emergency Medicine

## 2017-03-26 DIAGNOSIS — W01198A Fall on same level from slipping, tripping and stumbling with subsequent striking against other object, initial encounter: Secondary | ICD-10-CM | POA: Diagnosis not present

## 2017-03-26 DIAGNOSIS — Z794 Long term (current) use of insulin: Secondary | ICD-10-CM | POA: Diagnosis not present

## 2017-03-26 DIAGNOSIS — R0781 Pleurodynia: Secondary | ICD-10-CM | POA: Diagnosis not present

## 2017-03-26 DIAGNOSIS — S0083XA Contusion of other part of head, initial encounter: Secondary | ICD-10-CM | POA: Diagnosis not present

## 2017-03-26 DIAGNOSIS — S59092A Other physeal fracture of lower end of ulna, left arm, initial encounter for closed fracture: Secondary | ICD-10-CM | POA: Diagnosis not present

## 2017-03-26 DIAGNOSIS — I10 Essential (primary) hypertension: Secondary | ICD-10-CM | POA: Insufficient documentation

## 2017-03-26 DIAGNOSIS — Y929 Unspecified place or not applicable: Secondary | ICD-10-CM | POA: Diagnosis not present

## 2017-03-26 DIAGNOSIS — Z79899 Other long term (current) drug therapy: Secondary | ICD-10-CM | POA: Insufficient documentation

## 2017-03-26 DIAGNOSIS — S0990XA Unspecified injury of head, initial encounter: Secondary | ICD-10-CM | POA: Diagnosis not present

## 2017-03-26 DIAGNOSIS — J449 Chronic obstructive pulmonary disease, unspecified: Secondary | ICD-10-CM | POA: Diagnosis not present

## 2017-03-26 DIAGNOSIS — Z7984 Long term (current) use of oral hypoglycemic drugs: Secondary | ICD-10-CM | POA: Insufficient documentation

## 2017-03-26 DIAGNOSIS — Y9301 Activity, walking, marching and hiking: Secondary | ICD-10-CM | POA: Diagnosis not present

## 2017-03-26 DIAGNOSIS — S098XXA Other specified injuries of head, initial encounter: Secondary | ICD-10-CM | POA: Diagnosis not present

## 2017-03-26 DIAGNOSIS — S52502A Unspecified fracture of the lower end of left radius, initial encounter for closed fracture: Secondary | ICD-10-CM | POA: Insufficient documentation

## 2017-03-26 DIAGNOSIS — Y998 Other external cause status: Secondary | ICD-10-CM | POA: Insufficient documentation

## 2017-03-26 DIAGNOSIS — S59292A Other physeal fracture of lower end of radius, left arm, initial encounter for closed fracture: Secondary | ICD-10-CM | POA: Diagnosis not present

## 2017-03-26 DIAGNOSIS — E119 Type 2 diabetes mellitus without complications: Secondary | ICD-10-CM | POA: Diagnosis not present

## 2017-03-26 DIAGNOSIS — S299XXA Unspecified injury of thorax, initial encounter: Secondary | ICD-10-CM | POA: Diagnosis not present

## 2017-03-26 DIAGNOSIS — S0081XA Abrasion of other part of head, initial encounter: Secondary | ICD-10-CM | POA: Diagnosis not present

## 2017-03-26 DIAGNOSIS — S52122A Displaced fracture of head of left radius, initial encounter for closed fracture: Secondary | ICD-10-CM | POA: Diagnosis not present

## 2017-03-26 DIAGNOSIS — T148XXA Other injury of unspecified body region, initial encounter: Secondary | ICD-10-CM

## 2017-03-26 DIAGNOSIS — Z23 Encounter for immunization: Secondary | ICD-10-CM | POA: Insufficient documentation

## 2017-03-26 DIAGNOSIS — S52602A Unspecified fracture of lower end of left ulna, initial encounter for closed fracture: Secondary | ICD-10-CM

## 2017-03-26 DIAGNOSIS — Z4789 Encounter for other orthopedic aftercare: Secondary | ICD-10-CM | POA: Diagnosis not present

## 2017-03-26 DIAGNOSIS — S6992XA Unspecified injury of left wrist, hand and finger(s), initial encounter: Secondary | ICD-10-CM | POA: Diagnosis present

## 2017-03-26 LAB — CBC WITH DIFFERENTIAL/PLATELET
Basophils Absolute: 0.1 10*3/uL (ref 0–0.1)
Basophils Relative: 1 %
EOS ABS: 0.1 10*3/uL (ref 0–0.7)
EOS PCT: 1 %
HCT: 40.6 % (ref 35.0–47.0)
Hemoglobin: 13.2 g/dL (ref 12.0–16.0)
LYMPHS ABS: 1 10*3/uL (ref 1.0–3.6)
LYMPHS PCT: 9 %
MCH: 26.9 pg (ref 26.0–34.0)
MCHC: 32.4 g/dL (ref 32.0–36.0)
MCV: 82.9 fL (ref 80.0–100.0)
MONOS PCT: 6 %
Monocytes Absolute: 0.7 10*3/uL (ref 0.2–0.9)
Neutro Abs: 10.2 10*3/uL — ABNORMAL HIGH (ref 1.4–6.5)
Neutrophils Relative %: 83 %
PLATELETS: 226 10*3/uL (ref 150–440)
RBC: 4.89 MIL/uL (ref 3.80–5.20)
RDW: 15 % — ABNORMAL HIGH (ref 11.5–14.5)
WBC: 12.2 10*3/uL — AB (ref 3.6–11.0)

## 2017-03-26 LAB — BASIC METABOLIC PANEL
Anion gap: 9 (ref 5–15)
BUN: 21 mg/dL — AB (ref 6–20)
CALCIUM: 9.3 mg/dL (ref 8.9–10.3)
CHLORIDE: 103 mmol/L (ref 101–111)
CO2: 24 mmol/L (ref 22–32)
CREATININE: 0.94 mg/dL (ref 0.44–1.00)
GFR calc non Af Amer: >60 mL/min (ref >60)
Glucose, Bld: 259 mg/dL — ABNORMAL HIGH (ref 65–99)
Potassium: 4 mmol/L (ref 3.5–5.1)
SODIUM: 136 mmol/L (ref 135–145)

## 2017-03-26 LAB — PROTIME-INR
INR: 0.99
PROTHROMBIN TIME: 13.1 s (ref 11.4–15.2)

## 2017-03-26 LAB — APTT: aPTT: 31 seconds (ref 24–36)

## 2017-03-26 LAB — TYPE AND SCREEN
ABO/RH(D): O POS
ANTIBODY SCREEN: NEGATIVE

## 2017-03-26 MED ORDER — HYDROCODONE-ACETAMINOPHEN 5-325 MG PO TABS: 1.0000 | ORAL_TABLET | ORAL | 0 refills | Status: DC | PRN

## 2017-03-26 MED ORDER — HYDROCODONE-ACETAMINOPHEN 5-325 MG PO TABS
ORAL_TABLET | ORAL | Status: AC
  Administered 2017-03-26: 1 via ORAL
  Filled 2017-03-26: qty 1

## 2017-03-26 MED ORDER — LIDOCAINE HCL (PF) 1 % IJ SOLN
INTRAMUSCULAR | Status: AC
  Filled 2017-03-26: qty 10

## 2017-03-26 MED ORDER — HYDROCODONE-ACETAMINOPHEN 5-325 MG PO TABS
1.0000 | ORAL_TABLET | Freq: Once | ORAL | Status: AC
  Administered 2017-03-26: 1 via ORAL

## 2017-03-26 MED ORDER — TETANUS-DIPHTH-ACELL PERTUSSIS 5-2.5-18.5 LF-MCG/0.5 IM SUSP
0.5000 mL | Freq: Once | INTRAMUSCULAR | Status: AC
  Administered 2017-03-26: 0.5 mL via INTRAMUSCULAR
  Filled 2017-03-26: qty 0.5

## 2017-03-26 NOTE — ED Provider Notes (Signed)
[facility] Emergency Department Provider Note  ____________________________________________   First MD Initiated Contact with Patient 03/26/17 1517     (approximate)  I have reviewed the triage vital signs and the nursing notes.   HISTORY  Chief Complaint Fall   HPI Isabella White is a 71 y.o. female with a history of diabetes as well as hypertension was presenting to the emergency department after trip and fall. She says that she was walking when she tripped over a rock, breaking her fall with her left hand. She now has a deformity to her left wrist. She also hit her head but did not suffer any loss of consciousness. She denies being on any anticoagulation. She says that she is able to feel her left hand distal to the site of the injury as well as move her fingers. Does not know the date of her last tetanus shot.   Past Medical History:  Diagnosis Date  . Anemia   . Arthritis   . Asthma   . COPD (chronic obstructive pulmonary disease) (HCC)   . Cough    CHRONIC  . Depression   . Diabetes mellitus without complication (HCC)    HAS GLUCOSE SENSOR LEFT SHOULDER  . Dyspnea   . GERD (gastroesophageal reflux disease)   . Heart murmur   . Hypertension   . Sleep apnea    CPAP  . Wheezing     There are no active problems to display for this patient.   Past Surgical History:  Procedure Laterality Date  . ABDOMINAL HYSTERECTOMY    . BREAST BIOPSY Right 1984  . CATARACT EXTRACTION W/PHACO Left 09/15/2016   Procedure: CATARACT EXTRACTION PHACO AND INTRAOCULAR LENS PLACEMENT (IOC);  Surgeon: Nevada CraneBradley Mark King, MD;  Location: [facility] ORS;  Service: Ophthalmology;  Laterality: Left;  Lot# [id number] H US: 00:48.9 AP%: 15.1 CDE: 7.39  . CATARACT EXTRACTION W/PHACO Right 11/10/2016   Procedure: CATARACT EXTRACTION PHACO AND INTRAOCULAR LENS PLACEMENT (IOC);  Surgeon: Nevada CraneBradley Mark King, MD;  Location: [facility] ORS;  Service: Ophthalmology;  Laterality: Right;  US  56.6 AP% 10.8 CDE 6.39 Fluid Pack lot # [id number]  . FRACTURE SURGERY     ORIF ANKLE    Prior to Admission medications   Medication Sig Start Date End Date Taking? Authorizing Provider  albuterol (PROVENTIL HFA;VENTOLIN HFA) 108 (90 Base) MCG/ACT inhaler Inhale into the lungs every 6 (six) hours as needed for wheezing or shortness of breath.    [provider]  amLODipine (NORVASC) 5 MG tablet Take 5 mg by mouth daily.    [provider]  budesonide-formoterol (SYMBICORT) 160-4.5 MCG/ACT inhaler Inhale 2 puffs into the lungs 2 (two) times daily.    [provider]  cyanocobalamin (,VITAMIN B-12,) 1000 MCG/ML injection Inject 1,000 mcg into the muscle every 30 (thirty) days.    [provider]  ferrous sulfate 325 (65 FE) MG tablet Take 325 mg by mouth daily with breakfast.    [provider]  hydrochlorothiazide (HYDRODIURIL) 25 MG tablet Take 25 mg by mouth daily.    [provider]  insulin lispro (HUMALOG) 100 UNIT/ML injection Inject 2-4 Units into the skin 3 (three) times daily before meals.     [provider]  loratadine (CLARITIN) 10 MG tablet Take 10 mg by mouth daily.    [provider]  losartan (COZAAR) 100 MG tablet Take 100 mg by mouth daily.    [provider]  metFORMIN (GLUCOPHAGE-XR) 500 MG 24 hr tablet  Take 2,000 mg by mouth at bedtime.    [provider]  metoprolol (LOPRESSOR) 50 MG tablet Take 25-50 mg by mouth See admin instructions. One tablet, 50 mg, in am and one half tablet, 25 mg, in pm    [provider]  montelukast (SINGULAIR) 10 MG tablet Take 10 mg by mouth at bedtime.    [provider]  Multiple Vitamin (MULTIVITAMIN) tablet Take 1 tablet by mouth daily.    [provider]  omeprazole (PRILOSEC) 40 MG capsule Take 40 mg by mouth daily.    [provider]  sertraline (ZOLOFT) 50 MG tablet Take 50 mg by mouth at bedtime.    [provider]  vitamin B-12 (CYANOCOBALAMIN) 1000 MCG tablet Take 1,000 mcg by mouth daily.    [provider]    Allergies Sulfa antibiotics; Valium [diazepam]; and Penicillins  No family history on file.  Social History Social History  Substance Use Topics  . Smoking status: Never Smoker  . Smokeless tobacco: Never Used  . Alcohol use No    Review of Systems  Constitutional: No fever/chills Eyes: No visual changes. ENT: No sore throat. Cardiovascular: Denies chest pain. Respiratory: Denies shortness of breath. Gastrointestinal: No abdominal pain.  No nausea, no vomiting.  No diarrhea.  No constipation. Genitourinary: Negative for dysuria. Musculoskeletal: Negative for back pain. Skin: Negative for rash. Neurological: Negative for headaches, focal weakness or numbness.   ____________________________________________   PHYSICAL EXAM:  VITAL SIGNS: ED Triage Vitals  Enc Vitals Group     BP 03/26/17 1512 (!) 151/75     Pulse Rate 03/26/17 1512 94     Resp 03/26/17 1512 18     Temp 03/26/17 1512 98 F (36.7 C)     Temp Source 03/26/17 1512 Oral     SpO2 03/26/17 1512 99 %     Weight 03/26/17 1512 184 lb (83.5 kg)     Height 03/26/17 1512 5\' 1"  (1.549 m)     Head Circumference --      Peak Flow --      Pain Score 03/26/17 1511 8     Pain Loc --      Pain Edu? --      Excl. in GC? --     Constitutional: Alert and oriented. Well appearing and in no acute distress. Eyes: Conjunctivae are normal.  Head: Hematoma to the left side of the forehead that is about 3 x 4 cm and oval shaped. There is a small abrasion overlying without any evidence of laceration. No depression or active bleeding. Nose: No congestion/rhinnorhea. Mouth/Throat: Mucous membranes are moist.  Neck: No stridor.  No tenderness to palpation of the midline cervical spine. No deformity or step-off. Cardiovascular: Normal rate, regular rhythm. Grossly normal heart sounds.  Good peripheral  circulation. Respiratory: Normal respiratory effort.  No retractions. Lungs CTAB. Gastrointestinal: Soft and nontender. No distention.  Musculoskeletal: No lower extremity tenderness nor edema.  No joint effusions.Small abrasion overlying the anterior left knee as well but without any active bleeding. No tenderness or deformity to the knee.  Left upper extremity with posterior swelling and deformity to the left wrist region. There are scattered abrasions to the wrist and elbow. However, there is no tenderness or swelling to the elbow. The radial pulses intact. There is brisk capillary refill to the nailbeds. The patient able to range her fingers and has sensation intact to light touch.  Neurologic:  Normal speech and language. No gross focal neurologic  deficits are appreciated. Skin:  Skin is warm, dry and intact. No rash noted. Psychiatric: Mood and affect are normal. Speech and behavior are normal.  ____________________________________________   LABS (all labs ordered are listed, but only abnormal results are displayed)  Labs Reviewed - No data to display ____________________________________________  EKG   ____________________________________________  RADIOLOGY  No acute finding on the CT of the head nor the rib series. Left wrist with fracture of the distal radius with the placement as well as distal ulnar metaphyseal fracture.  Postreduction with improved alignment of the bones to the left wrist. ____________________________________________   PROCEDURES  Reduction of dislocation Date/Time: 3:34 PM Performed by: Arelia Longest Authorized by: Arelia Longest Consent: Verbal consent obtained. Risks and benefits: risks, benefits and alternatives were discussed Consent given by: patient Required items: required blood products, implants, devices, and special equipment available Time out: Immediately prior to procedure a "time out" was called to verify the correct  patient, procedure, equipment, support staff and site/side marked as required.  Patient sedated: No sedation.  Hematoma block injected dorsally to the dorsal soft tissue swelling. A small amount of blood was pulled back and then 10 mL of lidocaine, 1%, was injected.  Vitals: Vital signs were monitored during sedation. Patient tolerance: Patient tolerated the procedure well with no immediate complications. Joint:  Left distal ulna and radius Reduction technique: Traction countertraction with dorsal pressure as well as a small exaggeration of the deformity prior to reduction.     Procedures  Critical Care performed:   ____________________________________________   INITIAL IMPRESSION / ASSESSMENT AND PLAN / ED COURSE  Pertinent labs & imaging results that were available during my care of the patient were reviewed by me and considered in my medical decision making (see chart for details).  ----------------------------------------- 6:01 PM on 03/26/2017 ----------------------------------------- Hematoma block done a second time just before the reduction because of a delay between initial block and reduction due to delay in the patient going to the CAT scan. Patient tolerated the procedure well.  Patient reduced and splinted. Improvement of alignment after reduction with the patient being neurovascularly intact in her sugar tong splint. I discussed the case Dr. Odis Luster who would like to see the patient is off his 815 a.m. tomorrow. The patient knows that she will going to surgery and knows that she must stay nothing by mouth after midnight tonight. She does return for any worsening or concerning symptoms. She is understanding of the plan and willing to comply.      ____________________________________________   FINAL CLINICAL IMPRESSION(S) / ED DIAGNOSES  Left distal radius and ulnar fracture. Abrasions.    NEW MEDICATIONS STARTED DURING THIS VISIT:  New Prescriptions   No  medications on file     Note:  This document was prepared using Dragon voice recognition software and may include unintentional dictation errors.     Myrna Blazer, MD 03/26/17 [phone]

## 2017-03-26 NOTE — ED Notes (Signed)
Patient assisted to bedside commode.

## 2017-03-26 NOTE — ED Triage Notes (Signed)
Pt states that she was walking into her home and thinks she may have rolled her ankle causing her to fall and hit the left side of her head, left arm, left knee, and has an obvious deformity to the left ankle

## 2017-03-27 ENCOUNTER — Ambulatory Visit
Admission: RE | Admit: 2017-03-27 | Discharge: 2017-03-27 | Disposition: A | Discharge status: Home / Self Care | Payer: PPO | Source: Ambulatory Visit | Attending: Orthopedic Surgery | Admitting: Orthopedic Surgery

## 2017-03-27 ENCOUNTER — Ambulatory Visit: Payer: Self-pay | Admitting: Orthopedic Surgery

## 2017-03-27 ENCOUNTER — Ambulatory Visit: Payer: PPO | Admitting: Anesthesiology

## 2017-03-27 ENCOUNTER — Encounter
Admission: RE | Disposition: A | Discharge status: Home / Self Care | Payer: Self-pay | Source: Ambulatory Visit | Attending: Orthopedic Surgery

## 2017-03-27 ENCOUNTER — Encounter: Payer: Self-pay | Admitting: Orthopedic Surgery

## 2017-03-27 ENCOUNTER — Other Ambulatory Visit: Payer: Self-pay | Admitting: Orthopedic Surgery

## 2017-03-27 DIAGNOSIS — Z888 Allergy status to other drugs, medicaments and biological substances status: Secondary | ICD-10-CM | POA: Diagnosis not present

## 2017-03-27 DIAGNOSIS — K219 Gastro-esophageal reflux disease without esophagitis: Secondary | ICD-10-CM | POA: Diagnosis not present

## 2017-03-27 DIAGNOSIS — M25532 Pain in left wrist: Secondary | ICD-10-CM | POA: Diagnosis not present

## 2017-03-27 DIAGNOSIS — I10 Essential (primary) hypertension: Secondary | ICD-10-CM | POA: Insufficient documentation

## 2017-03-27 DIAGNOSIS — J449 Chronic obstructive pulmonary disease, unspecified: Secondary | ICD-10-CM | POA: Insufficient documentation

## 2017-03-27 DIAGNOSIS — S62102A Fracture of unspecified carpal bone, left wrist, initial encounter for closed fracture: Secondary | ICD-10-CM | POA: Diagnosis not present

## 2017-03-27 DIAGNOSIS — Z880 Allergy status to penicillin: Secondary | ICD-10-CM | POA: Diagnosis not present

## 2017-03-27 DIAGNOSIS — E119 Type 2 diabetes mellitus without complications: Secondary | ICD-10-CM | POA: Insufficient documentation

## 2017-03-27 DIAGNOSIS — Z90710 Acquired absence of both cervix and uterus: Secondary | ICD-10-CM | POA: Insufficient documentation

## 2017-03-27 DIAGNOSIS — W1830XA Fall on same level, unspecified, initial encounter: Secondary | ICD-10-CM | POA: Diagnosis not present

## 2017-03-27 DIAGNOSIS — S52692B Other fracture of lower end of left ulna, initial encounter for open fracture type I or II: Secondary | ICD-10-CM | POA: Diagnosis not present

## 2017-03-27 DIAGNOSIS — Z7951 Long term (current) use of inhaled steroids: Secondary | ICD-10-CM | POA: Diagnosis not present

## 2017-03-27 DIAGNOSIS — Z79899 Other long term (current) drug therapy: Secondary | ICD-10-CM | POA: Insufficient documentation

## 2017-03-27 DIAGNOSIS — S52592A Other fractures of lower end of left radius, initial encounter for closed fracture: Secondary | ICD-10-CM | POA: Insufficient documentation

## 2017-03-27 DIAGNOSIS — S52572A Other intraarticular fracture of lower end of left radius, initial encounter for closed fracture: Secondary | ICD-10-CM | POA: Diagnosis present

## 2017-03-27 DIAGNOSIS — Z882 Allergy status to sulfonamides status: Secondary | ICD-10-CM | POA: Diagnosis not present

## 2017-03-27 DIAGNOSIS — G4730 Sleep apnea, unspecified: Secondary | ICD-10-CM | POA: Insufficient documentation

## 2017-03-27 DIAGNOSIS — S52502A Unspecified fracture of the lower end of left radius, initial encounter for closed fracture: Secondary | ICD-10-CM | POA: Diagnosis not present

## 2017-03-27 DIAGNOSIS — F329 Major depressive disorder, single episode, unspecified: Secondary | ICD-10-CM | POA: Insufficient documentation

## 2017-03-27 DIAGNOSIS — G8918 Other acute postprocedural pain: Secondary | ICD-10-CM | POA: Diagnosis not present

## 2017-03-27 DIAGNOSIS — Z794 Long term (current) use of insulin: Secondary | ICD-10-CM | POA: Insufficient documentation

## 2017-03-27 DIAGNOSIS — S52602B Unspecified fracture of lower end of left ulna, initial encounter for open fracture type I or II: Secondary | ICD-10-CM | POA: Diagnosis not present

## 2017-03-27 HISTORY — PX: ORIF WRIST FRACTURE: SHX2133

## 2017-03-27 HISTORY — PX: INCISION AND DRAINAGE: SHX5863

## 2017-03-27 LAB — GLUCOSE, CAPILLARY
Glucose-Capillary: 204 mg/dL — ABNORMAL HIGH (ref 65–99)
Glucose-Capillary: 154 mg/dL — ABNORMAL HIGH (ref 65–99)

## 2017-03-27 SURGERY — OPEN REDUCTION INTERNAL FIXATION (ORIF) WRIST FRACTURE
Anesthesia: General | Site: Wrist | Laterality: Left | Wound class: Clean

## 2017-03-27 MED ORDER — ROPIVACAINE HCL 5 MG/ML IJ SOLN
INTRAMUSCULAR | Status: AC
  Filled 2017-03-27: qty 30

## 2017-03-27 MED ORDER — CLINDAMYCIN PHOSPHATE 900 MG/50ML IV SOLN
900.0000 mg | INTRAVENOUS | Status: AC
  Administered 2017-03-27: 900 mg via INTRAVENOUS

## 2017-03-27 MED ORDER — CHLORHEXIDINE GLUCONATE 4 % EX LIQD
60.0000 mL | Freq: Once | CUTANEOUS | Status: AC
  Administered 2017-03-27: 4 via TOPICAL

## 2017-03-27 MED ORDER — ACETAMINOPHEN 500 MG PO TABS
ORAL_TABLET | ORAL | Status: AC
  Filled 2017-03-27: qty 2

## 2017-03-27 MED ORDER — PROPOFOL 10 MG/ML IV BOLUS
INTRAVENOUS | Status: DC | PRN
  Administered 2017-03-27: 20 mg via INTRAVENOUS
  Administered 2017-03-27: 30 mg via INTRAVENOUS

## 2017-03-27 MED ORDER — FENTANYL CITRATE (PF) 100 MCG/2ML IJ SOLN
50.0000 ug | Freq: Once | INTRAMUSCULAR | Status: AC
  Administered 2017-03-27: 50 ug via INTRAVENOUS

## 2017-03-27 MED ORDER — BACITRACIN 50000 UNITS IM SOLR
INTRAMUSCULAR | Status: AC
  Filled 2017-03-27: qty 1

## 2017-03-27 MED ORDER — PROMETHAZINE HCL 25 MG/ML IJ SOLN: 6.2500 mg | INTRAMUSCULAR | Status: DC | PRN

## 2017-03-27 MED ORDER — ACETAMINOPHEN 500 MG PO TABS
1000.0000 mg | ORAL_TABLET | Freq: Once | ORAL | Status: AC
  Administered 2017-03-27: 1000 mg via ORAL

## 2017-03-27 MED ORDER — FENTANYL CITRATE (PF) 100 MCG/2ML IJ SOLN
INTRAMUSCULAR | Status: AC
  Filled 2017-03-27: qty 2

## 2017-03-27 MED ORDER — FAMOTIDINE 20 MG PO TABS
20.0000 mg | ORAL_TABLET | Freq: Once | ORAL | Status: AC
  Administered 2017-03-27: 20 mg via ORAL

## 2017-03-27 MED ORDER — HYDROCODONE-ACETAMINOPHEN 5-325 MG PO TABS: 1.0000 | ORAL_TABLET | ORAL | 0 refills | Status: AC | PRN

## 2017-03-27 MED ORDER — CLINDAMYCIN PHOSPHATE 900 MG/50ML IV SOLN
INTRAVENOUS | Status: AC
Start: 2017-03-27 — End: 2017-03-27
  Filled 2017-03-27: qty 50

## 2017-03-27 MED ORDER — LIDOCAINE HCL (PF) 1 % IJ SOLN
INTRAMUSCULAR | Status: AC
  Filled 2017-03-27: qty 5

## 2017-03-27 MED ORDER — LACTATED RINGERS IV SOLN
INTRAVENOUS | Status: DC
  Administered 2017-03-27: 10:00:00 via INTRAVENOUS

## 2017-03-27 MED ORDER — METOPROLOL TARTRATE 50 MG PO TABS
ORAL_TABLET | ORAL | Status: AC
  Filled 2017-03-27: qty 1

## 2017-03-27 MED ORDER — OXYCODONE HCL 5 MG PO TABS: 5.0000 mg | ORAL_TABLET | Freq: Once | ORAL | Status: DC | PRN

## 2017-03-27 MED ORDER — CEPHALEXIN 500 MG PO CAPS: 500.0000 mg | ORAL_CAPSULE | Freq: Four times a day (QID) | ORAL | 0 refills | Status: AC

## 2017-03-27 MED ORDER — METOPROLOL TARTRATE 50 MG PO TABS
50.0000 mg | ORAL_TABLET | Freq: Once | ORAL | Status: AC
  Administered 2017-03-27: 50 mg via ORAL

## 2017-03-27 MED ORDER — ONDANSETRON HCL 4 MG/2ML IJ SOLN
INTRAMUSCULAR | Status: AC
  Filled 2017-03-27: qty 2

## 2017-03-27 MED ORDER — MEPERIDINE HCL 50 MG/ML IJ SOLN: 6.2500 mg | INTRAMUSCULAR | Status: DC | PRN

## 2017-03-27 MED ORDER — PHENYLEPHRINE HCL 10 MG/ML IJ SOLN
INTRAMUSCULAR | Status: DC | PRN
  Administered 2017-03-27 (×8): 100 ug via INTRAVENOUS
  Administered 2017-03-27 (×2): 50 ug via INTRAVENOUS
  Administered 2017-03-27: 100 ug via INTRAVENOUS

## 2017-03-27 MED ORDER — ROPIVACAINE HCL 5 MG/ML IJ SOLN
INTRAMUSCULAR | Status: DC | PRN
  Administered 2017-03-27: 30 mL via PERINEURAL

## 2017-03-27 MED ORDER — SODIUM CHLORIDE 0.9 % IJ SOLN
INTRAMUSCULAR | Status: AC
  Filled 2017-03-27: qty 10

## 2017-03-27 MED ORDER — SODIUM CHLORIDE 0.9 % IR SOLN
Status: DC | PRN
  Administered 2017-03-27: 500 mL

## 2017-03-27 MED ORDER — LIDOCAINE HCL (PF) 1 % IJ SOLN
INTRAMUSCULAR | Status: DC | PRN
  Administered 2017-03-27: 3 mL

## 2017-03-27 MED ORDER — PROPOFOL 500 MG/50ML IV EMUL
INTRAVENOUS | Status: AC
  Filled 2017-03-27: qty 50

## 2017-03-27 MED ORDER — OXYCODONE HCL 5 MG/5ML PO SOLN: 5.0000 mg | Freq: Once | ORAL | Status: DC | PRN

## 2017-03-27 MED ORDER — FAMOTIDINE 20 MG PO TABS
ORAL_TABLET | ORAL | Status: AC
  Filled 2017-03-27: qty 1

## 2017-03-27 MED ORDER — PROPOFOL 500 MG/50ML IV EMUL
INTRAVENOUS | Status: DC | PRN
  Administered 2017-03-27: 75 ug/kg/min via INTRAVENOUS

## 2017-03-27 MED ORDER — GABAPENTIN 300 MG PO CAPS
ORAL_CAPSULE | ORAL | Status: AC
  Filled 2017-03-27: qty 1

## 2017-03-27 MED ORDER — ONDANSETRON HCL 4 MG/2ML IJ SOLN
INTRAMUSCULAR | Status: DC | PRN
  Administered 2017-03-27: 4 mg via INTRAVENOUS

## 2017-03-27 MED ORDER — FENTANYL CITRATE (PF) 100 MCG/2ML IJ SOLN: 25.0000 ug | INTRAMUSCULAR | Status: DC | PRN

## 2017-03-27 MED ORDER — GABAPENTIN 300 MG PO CAPS
300.0000 mg | ORAL_CAPSULE | Freq: Once | ORAL | Status: AC
  Administered 2017-03-27: 300 mg via ORAL

## 2017-03-27 SURGICAL SUPPLY — 52 items
BALL PIN JURGAN WHITE 1/8 (MISCELLANEOUS) ×3 IMPLANT
BANDAGE ELASTIC 2 CLIP ST LF (GAUZE/BANDAGES/DRESSINGS) ×3 IMPLANT
BANDAGE ELASTIC 3 CLIP ST LF (GAUZE/BANDAGES/DRESSINGS) ×3 IMPLANT
BIT DRILL 2.2 SS TIBIAL (BIT) ×3 IMPLANT
BNDG ESMARK 4X12 TAN STRL LF (GAUZE/BANDAGES/DRESSINGS) ×3 IMPLANT
BRUSH SCRUB EZ  4% CHG (MISCELLANEOUS) ×2
BRUSH SCRUB EZ 4% CHG (MISCELLANEOUS) ×4 IMPLANT
CANISTER SUCT 1200ML W/VALVE (MISCELLANEOUS) ×3 IMPLANT
CAST PADDING 3X4FT ST 30246 (SOFTGOODS) ×1
CHLORAPREP W/TINT 26ML (MISCELLANEOUS) ×6 IMPLANT
CORD BIP STRL DISP 12FT (MISCELLANEOUS) ×3 IMPLANT
CUFF TOURN 18 STER (MISCELLANEOUS) ×3 IMPLANT
CUFF TOURN 24 STER (MISCELLANEOUS) IMPLANT
DRAPE FLUOR MINI C-ARM 54X84 (DRAPES) ×3 IMPLANT
DRAPE SHEET LG 3/4 BI-LAMINATE (DRAPES) ×3 IMPLANT
DRAPE SURG 17X11 SM STRL (DRAPES) ×3 IMPLANT
DRSG TEGADERM 2-3/8X2-3/4 SM (GAUZE/BANDAGES/DRESSINGS) ×6 IMPLANT
ELECT REM PT RETURN 9FT ADLT (ELECTROSURGICAL) ×3
ELECTRODE REM PT RTRN 9FT ADLT (ELECTROSURGICAL) ×2 IMPLANT
FORCEPS JEWEL BIP 4-3/4 STR (INSTRUMENTS) ×3 IMPLANT
GAUZE PETRO XEROFOAM 1X8 (MISCELLANEOUS) ×3 IMPLANT
GAUZE SPONGE 4X4 12PLY STRL (GAUZE/BANDAGES/DRESSINGS) ×3 IMPLANT
GLOVE INDICATOR 8.0 STRL GRN (GLOVE) ×3 IMPLANT
GLOVE SURG ORTHO 8.0 STRL STRW (GLOVE) ×6 IMPLANT
GOWN STRL REUS W/ TWL LRG LVL3 (GOWN DISPOSABLE) ×2 IMPLANT
GOWN STRL REUS W/ TWL XL LVL3 (GOWN DISPOSABLE) ×2 IMPLANT
GOWN STRL REUS W/TWL LRG LVL3 (GOWN DISPOSABLE) ×1
GOWN STRL REUS W/TWL XL LVL3 (GOWN DISPOSABLE) ×1
K-WIRE 1.6 (WIRE) ×2
K-WIRE FX5X1.6XNS BN SS (WIRE) ×4
K-wire ×3 IMPLANT
KIT RM TURNOVER STRD PROC AR (KITS) ×3 IMPLANT
KWIRE FX5X1.6XNS BN SS (WIRE) ×4 IMPLANT
NS IRRIG 1000ML POUR BTL (IV SOLUTION) ×3 IMPLANT
PACK EXTREMITY ARMC (MISCELLANEOUS) ×3 IMPLANT
PAD ABD DERMACEA PRESS 5X9 (GAUZE/BANDAGES/DRESSINGS) ×3 IMPLANT
PAD CAST CTTN 3X4 STRL (SOFTGOODS) ×2 IMPLANT
PLATE NARROW DVR LEFT (Plate) ×3 IMPLANT
SCREW LOCK 12X2.7X 3 LD (Screw) ×4 IMPLANT
SCREW LOCK 14X2.7X 3 LD TPR (Screw) ×4 IMPLANT
SCREW LOCK 16X2.7X 3 LD TPR (Screw) ×6 IMPLANT
SCREW LOCK 18X2.7X 3 LD TPR (Screw) ×4 IMPLANT
SCREW LOCKING 2.7X12MM (Screw) ×2 IMPLANT
SCREW LOCKING 2.7X14 (Screw) ×2 IMPLANT
SCREW LOCKING 2.7X16 (Screw) ×3 IMPLANT
SCREW LOCKING 2.7X18 (Screw) ×2 IMPLANT
SPLINT CAST 1 STEP 3X12 (MISCELLANEOUS) ×3 IMPLANT
STAPLER SKIN PROX 35W (STAPLE) ×3 IMPLANT
SUT ETHILON 3 0 PS 1 (SUTURE) ×3 IMPLANT
SUT VIC AB 3-0 SH 27 (SUTURE) ×2
SUT VIC AB 3-0 SH 27X BRD (SUTURE) ×4 IMPLANT
TOWEL OR 17X26 4PK STRL BLUE (TOWEL DISPOSABLE) ×3 IMPLANT

## 2017-03-27 NOTE — Discharge Instructions (Signed)
Patient should elevate the right forearm continuously for the next 72 hours. Patient's wrist should remain above her heart. She may flex and extend her fingers as tolerated. Patient may apply a bag of ice to the right wrist. Keep the dressing clean dry and intact until her follow-up in the office. Patient should follow up in 10 to 14 days. She should avoid any weightbearing or lifting with the right arm until follow-up. She should cover her dressing with a plastic bag to shower. Patient should contact the doctor immediately if she has loss of sensation, loss of motion, loss of circulation or extreme pain in the right wrist.  Dr. Odis LusterBowers' office phone number is [phone].

## 2017-03-27 NOTE — OR Nursing (Signed)
10:36To PACU for Block

## 2017-03-27 NOTE — Anesthesia Post-op Follow-up Note (Cosign Needed)
Anesthesia QCDR form completed.        

## 2017-03-27 NOTE — Op Note (Addendum)
03/27/2017  1:59 PM  PATIENT:  Isabella White    PRE-OPERATIVE DIAGNOSIS:  Left wrist fracture  POST-OPERATIVE DIAGNOSIS:  Distal radius fracture, intra-articular, distal ulna fracture grade 2 open  PROCEDURE:  OPEN REDUCTION INTERNAL FIXATION (ORIF) WRIST FRACTURE ULNAR PINNING, INCISION AND DRAINAGE of the Left wrist  SURGEON:  Lyndle HerrlichJames R Anjanette Gilkey, MD  TOURNIQUET TIME:  54  MIN  ANESTHESIA:   General  PREOPERATIVE INDICATIONS:  Isabella White is a  71 y.o. female with a diagnosis of wrist fracture who failed conservative measures and elected for surgical management.    The risks benefits and alternatives were discussed with the patient preoperatively including but not limited to the risks of infection, bleeding, nerve injury, malunion, nonunion, wrist stiffness, persistent wrist pain, osteoarthritis and the need for further surgery. Medical risks include but are not limited to DVT and pulmonary embolism, myocardial infarction, stroke, pneumonia, respiratory failure and death. Patient  understood these risks and wished to proceed.   OPERATIVE IMPLANTS:  hand innovations plate, [id number] K wire  OPERATIVE FINDINGS: comminuted distal radius fracture, intra-articular and open distal ulna fracture grade 2  OPERATIVE PROCEDURE: Patient was seen in the preoperative area. I marked the operative wrist according tl the hospital's correct site of surgery protocol. Patient was then brought to the operating roomand was placed supine on the operative table and underwent general anesthesia with an LMA.   The operative arm was prepped and draped in a sterile fashion. A timeout performed to verify the patient's name, date of birth, medical record number, correct site of surgery correct procedure to be performed. The timeout was also used a timeout to verify patient received antibiotics and appropriate instruments, implants and radiographs studies were available in the room. Once all in attendance were in  agreement case began.   Patient then had the operative extremity exsanguinated with an Esmarch. The tourniquet was placed on the upper extremity and inflated 250 mm.  A manual reduction of the fracture was performed. The fracture reduction was confirmed on FluoroScan imaging.  A linear incision was then made over the FCR tendon. The subcutaneous tissue was carefully dissected using Metzenbaum scissor and Adson pickup. Retractors were used to protect the radial artery and median nerve. The pronator quadratus was identified and incised and elevated off the volar surface of the distal radius. A [employer] volar plate was then positioned on the under surface of the distal radius. It was held into position with a K wire. The position of the plate was confirmed on AP and lateral images. Once the plate was in good position a cortical screw was placed bicortically in the sliding hole. Attention was then turned to the distal screws. The proximal row of pegs was placed first. Each individual peg hole was drilled and then measured with a depth gauge. The distal row was then drilled and smooth pegs were placed. The position and length of all screws were confirmed on AP and lateral FluoroScan imaging. Care was taken to avoid penetration of any peg through the articular surface of the distal radius.  The ulna was reduced through the same incision and a K-wire driven from distal to proximal. A Jurgen ball was placed.  The open area along the distal ulna was extended sharply with a scalpel and debrided with a scissor. Copious bacitracin laced saline was irrigated through the open area.  Once all distal screws were placed, the attention was turned back to placement of bicortical shaft screws. Additional screws were  placed in the plate to fill the remaining holes. The wound was then copiously irrigated. Final FluoroScan imaging of the construct were taken. The fracture was in anatomic position and the hardware was  well-positioned. The wound again was copiously irrigated. The soft tissue was then carefully over the plate. The skin was closed with staples. Xeroform and a dry sterile dressing were applied along with a volar splint. I was scrubbed and present for the entire case and all sharp and instrument counts were correct at the conclusion the case. The patient tolerated this procedure well and was awakened and taken to the recovery room in good condition.   Isabella White. Odis Luster, MD

## 2017-03-27 NOTE — Anesthesia Procedure Notes (Signed)
Performed by: Avani Sensabaugh Pre-anesthesia Checklist: Patient identified, Emergency Drugs available, Suction available, Patient being monitored and Timeout performed Oxygen Delivery Method: Simple face mask       

## 2017-03-27 NOTE — Progress Notes (Signed)
Left arm elevated on pillow and capillary refill positive to left hand and fingers   Warm to touch

## 2017-03-27 NOTE — Anesthesia Preprocedure Evaluation (Signed)
Anesthesia Evaluation  Patient identified by MRN, date of birth, ID band Patient awake    Reviewed: Allergy & Precautions, NPO status , Patient's Chart, lab work & pertinent test results  History of Anesthesia Complications Negative for: history of anesthetic complications  Airway Mallampati: III  TM Distance: >3 FB Neck ROM: Full    Dental no notable dental hx.    Pulmonary asthma , sleep apnea , COPD,  COPD inhaler,    breath sounds clear to auscultation- rhonchi (-) wheezing      Cardiovascular Exercise Tolerance: Good hypertension, Pt. on medications (-) CAD, (-) Past MI and (-) Cardiac Stents  Rhythm:Regular Rate:Normal - Systolic murmurs and - Diastolic murmurs    Neuro/Psych PSYCHIATRIC DISORDERS Depression negative neurological ROS     GI/Hepatic Neg liver ROS, GERD  ,  Endo/Other  diabetes, Insulin Dependent  Renal/GU negative Renal ROS     Musculoskeletal  (+) Arthritis ,   Abdominal (+) + obese,   Peds  Hematology  (+) anemia ,   Anesthesia Other Findings Past Medical History: No date: Anemia No date: Arthritis No date: Asthma No date: COPD (chronic obstructive pulmonary disease) (HCC) No date: Cough     Comment:  CHRONIC No date: Depression No date: Diabetes mellitus without complication (HCC)     Comment:  HAS GLUCOSE SENSOR LEFT SHOULDER No date: Dyspnea No date: GERD (gastroesophageal reflux disease) No date: Heart murmur No date: Hypertension No date: Sleep apnea     Comment:  CPAP No date: Wheezing   Reproductive/Obstetrics                             Anesthesia Physical Anesthesia Plan  ASA: III  Anesthesia Plan: General   Post-op Pain Management:  Regional for Post-op pain   Induction: Intravenous  PONV Risk Score and Plan: 2 and Propofol infusion  Airway Management Planned: Natural Airway  Additional Equipment:   Intra-op Plan:    Post-operative Plan:   Informed Consent: I have reviewed the patients History and Physical, chart, labs and discussed the procedure including the risks, benefits and alternatives for the proposed anesthesia with the patient or authorized representative who has indicated his/her understanding and acceptance.   Dental advisory given  Plan Discussed with: CRNA and Anesthesiologist  Anesthesia Plan Comments:         Anesthesia Quick Evaluation

## 2017-03-27 NOTE — Anesthesia Postprocedure Evaluation (Addendum)
Anesthesia Post Note  Patient: Isabella White  Procedure(s) Performed: Procedure(s) (LRB): OPEN REDUCTION INTERNAL FIXATION (ORIF) WRIST FRACTURE ULNAR PINNING (Left) INCISION AND DRAINAGE (Left)  Patient location during evaluation: PACU Anesthesia Type: General and Regional Level of consciousness: awake and alert and oriented Pain management: pain level controlled Vital Signs Assessment: post-procedure vital signs reviewed and stable Respiratory status: spontaneous breathing, nonlabored ventilation and respiratory function stable Cardiovascular status: blood pressure returned to baseline and stable Postop Assessment: no signs of nausea or vomiting Anesthetic complications: no     Last Vitals:  Vitals:   03/27/17 1420 03/27/17 1425  BP:  135/66  Pulse: 65 65  Resp: 16 16  Temp:      Last Pain:  Vitals:   03/27/17 1041  TempSrc:   PainSc: 4                  Alga Southall

## 2017-03-27 NOTE — Transfer of Care (Signed)
Immediate Anesthesia Transfer of Care Note  Patient: Isabella White  Procedure(s) Performed: Procedure(s): OPEN REDUCTION INTERNAL FIXATION (ORIF) WRIST FRACTURE ULNAR PINNING (Left) INCISION AND DRAINAGE (Left)  Patient Location: PACU  Anesthesia Type:General  Level of Consciousness: awake, alert  and oriented  Airway & Oxygen Therapy: Patient Spontanous Breathing and Patient connected to face mask oxygen  Post-op Assessment: Report given to RN and Post -op Vital signs reviewed and stable  Post vital signs: Reviewed and stable  Last Vitals:  Vitals:   03/27/17 1103 03/27/17 1109  BP: (!) 122/56 131/73  Pulse: 71 82  Resp: (!) 22 (!) 24  Temp:      Last Pain:  Vitals:   03/27/17 1041  TempSrc:   PainSc: 4          Complications: No apparent anesthesia complications

## 2017-03-27 NOTE — H&P (Signed)
PREOPERATIVE H&P  Chief Complaint: wrist fracture  HPI: Isabella White is a 71 y.o. female who presents for preoperative history and physical with a diagnosis of wrist fracture. Symptoms are rated as moderate to severe, and have been worsening.  This is significantly impairing activities of daily living.  She has elected for surgical management.   Past Medical History:  Diagnosis Date  . Anemia   . Arthritis   . Asthma   . COPD (chronic obstructive pulmonary disease) (HCC)   . Cough    CHRONIC  . Depression   . Diabetes mellitus without complication (HCC)    HAS GLUCOSE SENSOR LEFT SHOULDER  . Dyspnea   . GERD (gastroesophageal reflux disease)   . Heart murmur   . Hypertension   . Sleep apnea    CPAP  . Wheezing    Past Surgical History:  Procedure Laterality Date  . ABDOMINAL HYSTERECTOMY    . BREAST BIOPSY Right 1984  . CATARACT EXTRACTION W/PHACO Left 09/15/2016   Procedure: CATARACT EXTRACTION PHACO AND INTRAOCULAR LENS PLACEMENT (IOC);  Surgeon: Nevada Crane, MD;  Location: [facility] ORS;  Service: Ophthalmology;  Laterality: Left;  Lot# [id number] H [place]: 00:48.9 AP%: 15.1 CDE: 7.39  . CATARACT EXTRACTION W/PHACO Right 11/10/2016   Procedure: CATARACT EXTRACTION PHACO AND INTRAOCULAR LENS PLACEMENT (IOC);  Surgeon: Nevada Crane, MD;  Location: [facility] ORS;  Service: Ophthalmology;  Laterality: Right;  [place] 56.6 AP% 10.8 CDE 6.39 Fluid Pack lot # [id number]  . FRACTURE SURGERY     ORIF ANKLE   Social History   Social History  . Marital status: Divorced    Spouse name: N/A  . Number of children: N/A  . Years of education: N/A   Social History Main Topics  . Smoking status: Never Smoker  . Smokeless tobacco: Never Used  . Alcohol use No  . Drug use: Unknown  . Sexual activity: Not on file   Other Topics Concern  . Not on file   Social History Narrative  . No narrative on file   No family history on file. Allergies  Allergen Reactions  . Sulfa Antibiotics    . Valium [Diazepam]   . Penicillins Rash   Prior to Admission medications   Medication Sig Start Date End Date Taking? Authorizing Provider  budesonide-formoterol (SYMBICORT) 160-4.5 MCG/ACT inhaler Inhale 2 puffs into the lungs 2 (two) times daily.   Yes [provider]  cyanocobalamin (,VITAMIN B-12,) 1000 MCG/ML injection Inject 1,000 mcg into the muscle every 30 (thirty) days.   Yes [provider]  ferrous sulfate 325 (65 FE) MG tablet Take 325 mg by mouth daily with breakfast.   Yes [provider]  hydrochlorothiazide (HYDRODIURIL) 25 MG tablet Take 25 mg by mouth daily.   Yes [provider]  insulin lispro (HUMALOG) 100 UNIT/ML injection Inject 2-4 Units into the skin 3 (three) times daily before meals.    Yes [provider]  loratadine (CLARITIN) 10 MG tablet Take 10 mg by mouth daily.   Yes [provider]  losartan (COZAAR) 100 MG tablet Take 100 mg by mouth daily.   Yes [provider]  metFORMIN (GLUCOPHAGE-XR) 500 MG 24 hr tablet Take 2,000 mg by mouth at bedtime.   Yes [provider]  montelukast (SINGULAIR) 10 MG tablet Take 10 mg by mouth at bedtime.   Yes [provider]  Multiple Vitamin (MULTIVITAMIN) tablet Take 1 tablet by mouth daily.   Yes [provider]  omeprazole (  PRILOSEC) 40 MG capsule Take 40 mg by mouth daily.   Yes [provider]  sertraline (ZOLOFT) 50 MG tablet Take 50 mg by mouth at bedtime.   Yes [provider]  vitamin B-12 (CYANOCOBALAMIN) 1000 MCG tablet Take 1,000 mcg by mouth daily.   Yes [provider]  albuterol (PROVENTIL HFA;VENTOLIN HFA) 108 (90 Base) MCG/ACT inhaler Inhale into the lungs every 6 (six) hours as needed for wheezing or shortness of breath.    [provider]  amLODipine (NORVASC) 5 MG tablet Take 5 mg by mouth daily.    [provider]  HYDROcodone-acetaminophen (NORCO) 5-325 MG tablet Take 1-2  tablets by mouth every 4 (four) hours as needed for moderate pain. 03/26/17   Myrna BlazerSchaevitz, David Matthew, MD  metoprolol (LOPRESSOR) 50 MG tablet Take 25-50 mg by mouth See admin instructions. One tablet, 50 mg, in am and one half tablet, 25 mg, in pm    [provider]     Positive ROS: All other systems have been reviewed and were otherwise negative with the exception of those mentioned in the HPI and as above.  Physical Exam: General: Alert, no acute distress Cardiovascular: Regular rate and rhythm, no murmurs rubs or gallops.  No pedal edema Respiratory: Clear to auscultation bilaterally, no wheezes rales or rhonchi. No cyanosis, no use of accessory musculature GI: No organomegaly, abdomen is soft and non-tender nondistended with positive bowel sounds. Skin: Skin intact, no lesions within the operative field. Neurologic: Sensation intact distally Psychiatric: Patient is competent for consent with normal mood and affect Lymphatic: No axillary or cervical lymphadenopathy  MUSCULOSKELETAL: left upper extremity with obvious deformity, tenderness over the distal radius and ulna, skin is intact, +flexion/extension of all digits, good cap refill  Assessment: wrist fracture  Plan: Plan for Procedure(s): OPEN REDUCTION INTERNAL FIXATION (ORIF) WRIST FRACTURE ULNAR PINNING  I discussed the risks and benefits of surgery. The risks include but are not limited to infection, bleeding requiring blood transfusion, nerve or blood vessel injury, joint stiffness or loss of motion, persistent pain, weakness or instability, malunion, nonunion and hardware failure and the need for further surgery. Medical risks include but are not limited to DVT and pulmonary embolism, myocardial infarction, stroke, pneumonia, respiratory failure and death. Patient understood these risks and wished to proceed.   Lyndle HerrlichJames R Cigi Bega, MD   03/27/2017 11:03 AM

## 2017-03-27 NOTE — Anesthesia Procedure Notes (Signed)
Anesthesia Regional Block: Supraclavicular block   Pre-Anesthetic Checklist: ,, timeout performed, Correct Patient, Correct Site, Correct Laterality, Correct Procedure, Correct Position, site marked, Risks and benefits discussed,  Surgical consent,  Pre-op evaluation,  At surgeon's request and post-op pain management  Laterality: Left  Prep: chloraprep       Needles:  Injection technique: Single-shot  Needle Type: Stimiplex     Needle Length: 9cm  Needle Gauge: 22     Additional Needles:   Procedures: ultrasound guided,,,,,,,,  Narrative:  Start time: 03/27/2017 10:35 AM End time: 03/27/2017 10:44 AM Injection made incrementally with aspirations every 5 mL.  Performed by: Personally  Anesthesiologist: Endiya Klahr  Additional Notes: Functioning IV was confirmed and monitors were applied.  A Stimuplex needle was used. Sterile prep and drape,hand hygiene and sterile gloves were used.  Negative aspiration and negative test dose prior to incremental administration of local anesthetic. The patient tolerated the procedure well.

## 2017-03-27 NOTE — Progress Notes (Signed)
Pt not experiencing pain at present  Left arm and hand still numb

## 2017-04-04 DIAGNOSIS — S52502D Unspecified fracture of the lower end of left radius, subsequent encounter for closed fracture with routine healing: Secondary | ICD-10-CM | POA: Diagnosis not present

## 2017-04-11 DIAGNOSIS — S52502D Unspecified fracture of the lower end of left radius, subsequent encounter for closed fracture with routine healing: Secondary | ICD-10-CM | POA: Diagnosis not present

## 2017-04-11 DIAGNOSIS — S52502A Unspecified fracture of the lower end of left radius, initial encounter for closed fracture: Secondary | ICD-10-CM | POA: Diagnosis not present

## 2017-04-20 DIAGNOSIS — S52502D Unspecified fracture of the lower end of left radius, subsequent encounter for closed fracture with routine healing: Secondary | ICD-10-CM | POA: Diagnosis not present

## 2017-05-02 DIAGNOSIS — S52502A Unspecified fracture of the lower end of left radius, initial encounter for closed fracture: Secondary | ICD-10-CM | POA: Diagnosis not present

## 2017-05-02 DIAGNOSIS — S52502D Unspecified fracture of the lower end of left radius, subsequent encounter for closed fracture with routine healing: Secondary | ICD-10-CM | POA: Diagnosis not present

## 2017-05-09 DIAGNOSIS — S52502D Unspecified fracture of the lower end of left radius, subsequent encounter for closed fracture with routine healing: Secondary | ICD-10-CM | POA: Diagnosis not present

## 2017-05-16 DIAGNOSIS — S52502D Unspecified fracture of the lower end of left radius, subsequent encounter for closed fracture with routine healing: Secondary | ICD-10-CM | POA: Diagnosis not present

## 2017-05-23 DIAGNOSIS — S52502D Unspecified fracture of the lower end of left radius, subsequent encounter for closed fracture with routine healing: Secondary | ICD-10-CM | POA: Diagnosis not present

## 2017-05-23 DIAGNOSIS — J4520 Mild intermittent asthma, uncomplicated: Secondary | ICD-10-CM | POA: Diagnosis not present

## 2017-05-23 DIAGNOSIS — G4733 Obstructive sleep apnea (adult) (pediatric): Secondary | ICD-10-CM | POA: Diagnosis not present

## 2017-05-29 DIAGNOSIS — D509 Iron deficiency anemia, unspecified: Secondary | ICD-10-CM | POA: Diagnosis not present

## 2017-05-29 DIAGNOSIS — G4733 Obstructive sleep apnea (adult) (pediatric): Secondary | ICD-10-CM | POA: Diagnosis not present

## 2017-05-29 DIAGNOSIS — E538 Deficiency of other specified B group vitamins: Secondary | ICD-10-CM | POA: Diagnosis not present

## 2017-05-29 DIAGNOSIS — E1165 Type 2 diabetes mellitus with hyperglycemia: Secondary | ICD-10-CM | POA: Diagnosis not present

## 2017-05-29 DIAGNOSIS — Z794 Long term (current) use of insulin: Secondary | ICD-10-CM | POA: Diagnosis not present

## 2017-05-29 DIAGNOSIS — I10 Essential (primary) hypertension: Secondary | ICD-10-CM | POA: Diagnosis not present

## 2017-05-30 DIAGNOSIS — S52502A Unspecified fracture of the lower end of left radius, initial encounter for closed fracture: Secondary | ICD-10-CM | POA: Diagnosis not present

## 2017-06-01 DIAGNOSIS — S52502D Unspecified fracture of the lower end of left radius, subsequent encounter for closed fracture with routine healing: Secondary | ICD-10-CM | POA: Diagnosis not present

## 2017-06-02 DIAGNOSIS — E662 Morbid (severe) obesity with alveolar hypoventilation: Secondary | ICD-10-CM | POA: Diagnosis not present

## 2017-06-02 DIAGNOSIS — G4733 Obstructive sleep apnea (adult) (pediatric): Secondary | ICD-10-CM | POA: Diagnosis not present

## 2017-06-02 DIAGNOSIS — Z0000 Encounter for general adult medical examination without abnormal findings: Secondary | ICD-10-CM | POA: Diagnosis not present

## 2017-06-02 DIAGNOSIS — J4520 Mild intermittent asthma, uncomplicated: Secondary | ICD-10-CM | POA: Diagnosis not present

## 2017-06-02 DIAGNOSIS — Z794 Long term (current) use of insulin: Secondary | ICD-10-CM | POA: Diagnosis not present

## 2017-06-02 DIAGNOSIS — E538 Deficiency of other specified B group vitamins: Secondary | ICD-10-CM | POA: Diagnosis not present

## 2017-06-02 DIAGNOSIS — I10 Essential (primary) hypertension: Secondary | ICD-10-CM | POA: Diagnosis not present

## 2017-06-02 DIAGNOSIS — E7849 Other hyperlipidemia: Secondary | ICD-10-CM | POA: Diagnosis not present

## 2017-06-02 DIAGNOSIS — F3342 Major depressive disorder, recurrent, in full remission: Secondary | ICD-10-CM | POA: Diagnosis not present

## 2017-06-02 DIAGNOSIS — D509 Iron deficiency anemia, unspecified: Secondary | ICD-10-CM | POA: Diagnosis not present

## 2017-06-02 DIAGNOSIS — Z780 Asymptomatic menopausal state: Secondary | ICD-10-CM | POA: Diagnosis not present

## 2017-06-02 DIAGNOSIS — K219 Gastro-esophageal reflux disease without esophagitis: Secondary | ICD-10-CM | POA: Diagnosis not present

## 2017-06-02 DIAGNOSIS — E1165 Type 2 diabetes mellitus with hyperglycemia: Secondary | ICD-10-CM | POA: Diagnosis not present

## 2017-06-19 DIAGNOSIS — M810 Age-related osteoporosis without current pathological fracture: Secondary | ICD-10-CM | POA: Diagnosis not present

## 2017-06-19 DIAGNOSIS — Z780 Asymptomatic menopausal state: Secondary | ICD-10-CM | POA: Diagnosis not present

## 2017-06-27 DIAGNOSIS — E538 Deficiency of other specified B group vitamins: Secondary | ICD-10-CM | POA: Diagnosis not present

## 2017-06-27 DIAGNOSIS — E1165 Type 2 diabetes mellitus with hyperglycemia: Secondary | ICD-10-CM | POA: Diagnosis not present

## 2017-07-04 DIAGNOSIS — E669 Obesity, unspecified: Secondary | ICD-10-CM | POA: Diagnosis not present

## 2017-07-04 DIAGNOSIS — E1165 Type 2 diabetes mellitus with hyperglycemia: Secondary | ICD-10-CM | POA: Diagnosis not present

## 2017-07-04 DIAGNOSIS — E785 Hyperlipidemia, unspecified: Secondary | ICD-10-CM | POA: Diagnosis not present

## 2017-07-04 DIAGNOSIS — M810 Age-related osteoporosis without current pathological fracture: Secondary | ICD-10-CM | POA: Diagnosis not present

## 2017-07-04 DIAGNOSIS — E538 Deficiency of other specified B group vitamins: Secondary | ICD-10-CM | POA: Diagnosis not present

## 2017-07-04 DIAGNOSIS — Z6833 Body mass index (BMI) 33.0-33.9, adult: Secondary | ICD-10-CM | POA: Diagnosis not present

## 2017-08-25 DIAGNOSIS — E1165 Type 2 diabetes mellitus with hyperglycemia: Secondary | ICD-10-CM | POA: Diagnosis not present

## 2017-08-25 DIAGNOSIS — I10 Essential (primary) hypertension: Secondary | ICD-10-CM | POA: Diagnosis not present

## 2017-08-25 DIAGNOSIS — D509 Iron deficiency anemia, unspecified: Secondary | ICD-10-CM | POA: Diagnosis not present

## 2017-08-25 DIAGNOSIS — Z794 Long term (current) use of insulin: Secondary | ICD-10-CM | POA: Diagnosis not present

## 2017-08-25 DIAGNOSIS — E538 Deficiency of other specified B group vitamins: Secondary | ICD-10-CM | POA: Diagnosis not present

## 2017-09-01 ENCOUNTER — Other Ambulatory Visit: Payer: Self-pay | Admitting: Internal Medicine

## 2017-09-01 DIAGNOSIS — G4733 Obstructive sleep apnea (adult) (pediatric): Secondary | ICD-10-CM | POA: Diagnosis not present

## 2017-09-01 DIAGNOSIS — Z1211 Encounter for screening for malignant neoplasm of colon: Secondary | ICD-10-CM | POA: Diagnosis not present

## 2017-09-01 DIAGNOSIS — Z0000 Encounter for general adult medical examination without abnormal findings: Secondary | ICD-10-CM | POA: Diagnosis not present

## 2017-09-01 DIAGNOSIS — E7849 Other hyperlipidemia: Secondary | ICD-10-CM | POA: Diagnosis not present

## 2017-09-01 DIAGNOSIS — D509 Iron deficiency anemia, unspecified: Secondary | ICD-10-CM | POA: Diagnosis not present

## 2017-09-01 DIAGNOSIS — E662 Morbid (severe) obesity with alveolar hypoventilation: Secondary | ICD-10-CM | POA: Diagnosis not present

## 2017-09-01 DIAGNOSIS — K219 Gastro-esophageal reflux disease without esophagitis: Secondary | ICD-10-CM | POA: Diagnosis not present

## 2017-09-01 DIAGNOSIS — E1165 Type 2 diabetes mellitus with hyperglycemia: Secondary | ICD-10-CM | POA: Diagnosis not present

## 2017-09-01 DIAGNOSIS — Z1231 Encounter for screening mammogram for malignant neoplasm of breast: Secondary | ICD-10-CM

## 2017-09-01 DIAGNOSIS — J4520 Mild intermittent asthma, uncomplicated: Secondary | ICD-10-CM | POA: Diagnosis not present

## 2017-09-01 DIAGNOSIS — I10 Essential (primary) hypertension: Secondary | ICD-10-CM | POA: Diagnosis not present

## 2017-09-01 DIAGNOSIS — F3342 Major depressive disorder, recurrent, in full remission: Secondary | ICD-10-CM | POA: Diagnosis not present

## 2017-09-01 DIAGNOSIS — E538 Deficiency of other specified B group vitamins: Secondary | ICD-10-CM | POA: Diagnosis not present

## 2017-09-13 DIAGNOSIS — Z1211 Encounter for screening for malignant neoplasm of colon: Secondary | ICD-10-CM | POA: Diagnosis not present

## 2017-09-29 DIAGNOSIS — G4733 Obstructive sleep apnea (adult) (pediatric): Secondary | ICD-10-CM | POA: Diagnosis not present

## 2017-09-29 DIAGNOSIS — J4520 Mild intermittent asthma, uncomplicated: Secondary | ICD-10-CM | POA: Diagnosis not present

## 2017-09-29 DIAGNOSIS — R05 Cough: Secondary | ICD-10-CM | POA: Diagnosis not present

## 2017-10-20 ENCOUNTER — Ambulatory Visit
Admission: RE | Admit: 2017-10-20 | Discharge: 2017-10-20 | Disposition: A | Discharge status: Home / Self Care | Payer: PPO | Source: Ambulatory Visit | Attending: Internal Medicine | Admitting: Internal Medicine

## 2017-10-20 DIAGNOSIS — Z1231 Encounter for screening mammogram for malignant neoplasm of breast: Secondary | ICD-10-CM

## 2017-11-03 DIAGNOSIS — Z6833 Body mass index (BMI) 33.0-33.9, adult: Secondary | ICD-10-CM | POA: Diagnosis not present

## 2017-11-03 DIAGNOSIS — E1165 Type 2 diabetes mellitus with hyperglycemia: Secondary | ICD-10-CM | POA: Diagnosis not present

## 2017-11-03 DIAGNOSIS — E785 Hyperlipidemia, unspecified: Secondary | ICD-10-CM | POA: Diagnosis not present

## 2017-11-03 DIAGNOSIS — E669 Obesity, unspecified: Secondary | ICD-10-CM | POA: Diagnosis not present

## 2017-11-03 DIAGNOSIS — E538 Deficiency of other specified B group vitamins: Secondary | ICD-10-CM | POA: Diagnosis not present

## 2017-11-24 DIAGNOSIS — I10 Essential (primary) hypertension: Secondary | ICD-10-CM | POA: Diagnosis not present

## 2017-11-24 DIAGNOSIS — E538 Deficiency of other specified B group vitamins: Secondary | ICD-10-CM | POA: Diagnosis not present

## 2017-11-24 DIAGNOSIS — E7849 Other hyperlipidemia: Secondary | ICD-10-CM | POA: Diagnosis not present

## 2017-11-24 DIAGNOSIS — Z794 Long term (current) use of insulin: Secondary | ICD-10-CM | POA: Diagnosis not present

## 2017-11-24 DIAGNOSIS — E1165 Type 2 diabetes mellitus with hyperglycemia: Secondary | ICD-10-CM | POA: Diagnosis not present

## 2017-12-05 DIAGNOSIS — E538 Deficiency of other specified B group vitamins: Secondary | ICD-10-CM | POA: Diagnosis not present

## 2017-12-05 DIAGNOSIS — E785 Hyperlipidemia, unspecified: Secondary | ICD-10-CM | POA: Diagnosis not present

## 2017-12-05 DIAGNOSIS — Z794 Long term (current) use of insulin: Secondary | ICD-10-CM | POA: Diagnosis not present

## 2017-12-05 DIAGNOSIS — Z6833 Body mass index (BMI) 33.0-33.9, adult: Secondary | ICD-10-CM | POA: Diagnosis not present

## 2017-12-05 DIAGNOSIS — E1165 Type 2 diabetes mellitus with hyperglycemia: Secondary | ICD-10-CM | POA: Diagnosis not present

## 2017-12-05 DIAGNOSIS — E669 Obesity, unspecified: Secondary | ICD-10-CM | POA: Diagnosis not present

## 2018-02-23 DIAGNOSIS — R21 Rash and other nonspecific skin eruption: Secondary | ICD-10-CM | POA: Diagnosis not present

## 2018-05-14 DIAGNOSIS — D509 Iron deficiency anemia, unspecified: Secondary | ICD-10-CM | POA: Diagnosis not present

## 2018-05-14 DIAGNOSIS — F3342 Major depressive disorder, recurrent, in full remission: Secondary | ICD-10-CM | POA: Diagnosis not present

## 2018-05-14 DIAGNOSIS — J4520 Mild intermittent asthma, uncomplicated: Secondary | ICD-10-CM | POA: Diagnosis not present

## 2018-05-14 DIAGNOSIS — E7849 Other hyperlipidemia: Secondary | ICD-10-CM | POA: Diagnosis not present

## 2018-05-14 DIAGNOSIS — R011 Cardiac murmur, unspecified: Secondary | ICD-10-CM | POA: Diagnosis not present

## 2018-05-14 DIAGNOSIS — K219 Gastro-esophageal reflux disease without esophagitis: Secondary | ICD-10-CM | POA: Diagnosis not present

## 2018-05-14 DIAGNOSIS — G4733 Obstructive sleep apnea (adult) (pediatric): Secondary | ICD-10-CM | POA: Diagnosis not present

## 2018-05-14 DIAGNOSIS — E1165 Type 2 diabetes mellitus with hyperglycemia: Secondary | ICD-10-CM | POA: Diagnosis not present

## 2018-05-14 DIAGNOSIS — I10 Essential (primary) hypertension: Secondary | ICD-10-CM | POA: Diagnosis not present

## 2018-05-14 DIAGNOSIS — E538 Deficiency of other specified B group vitamins: Secondary | ICD-10-CM | POA: Diagnosis not present

## 2018-05-23 DIAGNOSIS — R011 Cardiac murmur, unspecified: Secondary | ICD-10-CM | POA: Diagnosis not present

## 2018-06-29 DIAGNOSIS — E669 Obesity, unspecified: Secondary | ICD-10-CM | POA: Diagnosis not present

## 2018-06-29 DIAGNOSIS — E1165 Type 2 diabetes mellitus with hyperglycemia: Secondary | ICD-10-CM | POA: Diagnosis not present

## 2018-06-29 DIAGNOSIS — E1169 Type 2 diabetes mellitus with other specified complication: Secondary | ICD-10-CM | POA: Diagnosis not present

## 2018-07-09 DIAGNOSIS — I10 Essential (primary) hypertension: Secondary | ICD-10-CM | POA: Diagnosis not present

## 2018-07-09 DIAGNOSIS — E1165 Type 2 diabetes mellitus with hyperglycemia: Secondary | ICD-10-CM | POA: Diagnosis not present

## 2018-07-09 DIAGNOSIS — E7849 Other hyperlipidemia: Secondary | ICD-10-CM | POA: Diagnosis not present

## 2018-07-09 DIAGNOSIS — F3342 Major depressive disorder, recurrent, in full remission: Secondary | ICD-10-CM | POA: Diagnosis not present

## 2018-07-09 DIAGNOSIS — K219 Gastro-esophageal reflux disease without esophagitis: Secondary | ICD-10-CM | POA: Diagnosis not present

## 2018-07-09 DIAGNOSIS — G4733 Obstructive sleep apnea (adult) (pediatric): Secondary | ICD-10-CM | POA: Diagnosis not present

## 2018-07-09 DIAGNOSIS — E538 Deficiency of other specified B group vitamins: Secondary | ICD-10-CM | POA: Diagnosis not present

## 2018-07-09 DIAGNOSIS — J4520 Mild intermittent asthma, uncomplicated: Secondary | ICD-10-CM | POA: Diagnosis not present

## 2018-07-09 DIAGNOSIS — D509 Iron deficiency anemia, unspecified: Secondary | ICD-10-CM | POA: Diagnosis not present

## 2018-07-13 DIAGNOSIS — J4520 Mild intermittent asthma, uncomplicated: Secondary | ICD-10-CM | POA: Diagnosis not present

## 2018-08-07 DIAGNOSIS — H353131 Nonexudative age-related macular degeneration, bilateral, early dry stage: Secondary | ICD-10-CM | POA: Diagnosis not present

## 2018-08-07 DIAGNOSIS — E119 Type 2 diabetes mellitus without complications: Secondary | ICD-10-CM | POA: Diagnosis not present

## 2018-09-17 DIAGNOSIS — I10 Essential (primary) hypertension: Secondary | ICD-10-CM | POA: Diagnosis not present

## 2018-09-17 DIAGNOSIS — E7849 Other hyperlipidemia: Secondary | ICD-10-CM | POA: Diagnosis not present

## 2018-09-19 DIAGNOSIS — I10 Essential (primary) hypertension: Secondary | ICD-10-CM | POA: Diagnosis not present

## 2018-09-19 DIAGNOSIS — J4520 Mild intermittent asthma, uncomplicated: Secondary | ICD-10-CM | POA: Diagnosis not present

## 2018-09-19 DIAGNOSIS — E7849 Other hyperlipidemia: Secondary | ICD-10-CM | POA: Diagnosis not present

## 2018-09-19 DIAGNOSIS — D509 Iron deficiency anemia, unspecified: Secondary | ICD-10-CM | POA: Diagnosis not present

## 2018-09-19 DIAGNOSIS — E1165 Type 2 diabetes mellitus with hyperglycemia: Secondary | ICD-10-CM | POA: Diagnosis not present

## 2018-09-19 DIAGNOSIS — G4733 Obstructive sleep apnea (adult) (pediatric): Secondary | ICD-10-CM | POA: Diagnosis not present

## 2018-09-19 DIAGNOSIS — I351 Nonrheumatic aortic (valve) insufficiency: Secondary | ICD-10-CM | POA: Diagnosis not present

## 2018-09-19 DIAGNOSIS — E538 Deficiency of other specified B group vitamins: Secondary | ICD-10-CM | POA: Diagnosis not present

## 2018-09-19 DIAGNOSIS — K219 Gastro-esophageal reflux disease without esophagitis: Secondary | ICD-10-CM | POA: Diagnosis not present

## 2018-09-19 DIAGNOSIS — Z1211 Encounter for screening for malignant neoplasm of colon: Secondary | ICD-10-CM | POA: Diagnosis not present

## 2018-09-19 DIAGNOSIS — Z1212 Encounter for screening for malignant neoplasm of rectum: Secondary | ICD-10-CM | POA: Diagnosis not present

## 2018-09-19 DIAGNOSIS — Z0000 Encounter for general adult medical examination without abnormal findings: Secondary | ICD-10-CM | POA: Diagnosis not present

## 2018-09-25 DIAGNOSIS — E1169 Type 2 diabetes mellitus with other specified complication: Secondary | ICD-10-CM | POA: Diagnosis not present

## 2018-09-25 DIAGNOSIS — E669 Obesity, unspecified: Secondary | ICD-10-CM | POA: Diagnosis not present

## 2018-09-25 DIAGNOSIS — E1165 Type 2 diabetes mellitus with hyperglycemia: Secondary | ICD-10-CM | POA: Diagnosis not present

## 2018-09-25 DIAGNOSIS — Z794 Long term (current) use of insulin: Secondary | ICD-10-CM | POA: Diagnosis not present

## 2018-09-27 DIAGNOSIS — J0100 Acute maxillary sinusitis, unspecified: Secondary | ICD-10-CM | POA: Diagnosis not present

## 2018-09-27 DIAGNOSIS — H109 Unspecified conjunctivitis: Secondary | ICD-10-CM | POA: Diagnosis not present

## 2018-10-12 DIAGNOSIS — Z1211 Encounter for screening for malignant neoplasm of colon: Secondary | ICD-10-CM | POA: Diagnosis not present

## 2018-10-12 DIAGNOSIS — Z1212 Encounter for screening for malignant neoplasm of rectum: Secondary | ICD-10-CM | POA: Diagnosis not present

## 2018-10-29 ENCOUNTER — Other Ambulatory Visit: Payer: Self-pay | Admitting: Internal Medicine

## 2018-10-29 DIAGNOSIS — Z1231 Encounter for screening mammogram for malignant neoplasm of breast: Secondary | ICD-10-CM

## 2018-11-02 ENCOUNTER — Ambulatory Visit
Admission: RE | Admit: 2018-11-02 | Discharge: 2018-11-02 | Disposition: A | Discharge status: Home / Self Care | Payer: PPO | Source: Ambulatory Visit | Attending: Internal Medicine | Admitting: Internal Medicine

## 2018-11-02 ENCOUNTER — Other Ambulatory Visit: Payer: Self-pay

## 2018-11-02 DIAGNOSIS — Z1231 Encounter for screening mammogram for malignant neoplasm of breast: Secondary | ICD-10-CM | POA: Insufficient documentation

## 2018-11-22 DIAGNOSIS — M791 Myalgia, unspecified site: Secondary | ICD-10-CM | POA: Diagnosis not present

## 2018-11-22 DIAGNOSIS — R002 Palpitations: Secondary | ICD-10-CM | POA: Diagnosis not present

## 2018-11-22 DIAGNOSIS — E669 Obesity, unspecified: Secondary | ICD-10-CM | POA: Diagnosis not present

## 2018-11-22 DIAGNOSIS — E1165 Type 2 diabetes mellitus with hyperglycemia: Secondary | ICD-10-CM | POA: Diagnosis not present

## 2018-11-22 DIAGNOSIS — Z794 Long term (current) use of insulin: Secondary | ICD-10-CM | POA: Diagnosis not present

## 2018-11-22 DIAGNOSIS — E1169 Type 2 diabetes mellitus with other specified complication: Secondary | ICD-10-CM | POA: Diagnosis not present

## 2019-02-01 DIAGNOSIS — J4520 Mild intermittent asthma, uncomplicated: Secondary | ICD-10-CM | POA: Diagnosis not present

## 2019-02-01 DIAGNOSIS — G4733 Obstructive sleep apnea (adult) (pediatric): Secondary | ICD-10-CM | POA: Diagnosis not present

## 2019-02-20 DIAGNOSIS — E669 Obesity, unspecified: Secondary | ICD-10-CM | POA: Diagnosis not present

## 2019-02-20 DIAGNOSIS — E1169 Type 2 diabetes mellitus with other specified complication: Secondary | ICD-10-CM | POA: Diagnosis not present

## 2019-02-27 DIAGNOSIS — E1122 Type 2 diabetes mellitus with diabetic chronic kidney disease: Secondary | ICD-10-CM | POA: Diagnosis not present

## 2019-02-27 DIAGNOSIS — Z794 Long term (current) use of insulin: Secondary | ICD-10-CM | POA: Diagnosis not present

## 2019-02-27 DIAGNOSIS — N183 Chronic kidney disease, stage 3 (moderate): Secondary | ICD-10-CM | POA: Diagnosis not present

## 2019-03-18 DIAGNOSIS — E1165 Type 2 diabetes mellitus with hyperglycemia: Secondary | ICD-10-CM | POA: Diagnosis not present

## 2019-03-18 DIAGNOSIS — E7849 Other hyperlipidemia: Secondary | ICD-10-CM | POA: Diagnosis not present

## 2019-03-18 DIAGNOSIS — E538 Deficiency of other specified B group vitamins: Secondary | ICD-10-CM | POA: Diagnosis not present

## 2019-03-25 DIAGNOSIS — E538 Deficiency of other specified B group vitamins: Secondary | ICD-10-CM | POA: Diagnosis not present

## 2019-03-25 DIAGNOSIS — E1169 Type 2 diabetes mellitus with other specified complication: Secondary | ICD-10-CM | POA: Diagnosis not present

## 2019-03-25 DIAGNOSIS — G4733 Obstructive sleep apnea (adult) (pediatric): Secondary | ICD-10-CM | POA: Diagnosis not present

## 2019-03-25 DIAGNOSIS — E1165 Type 2 diabetes mellitus with hyperglycemia: Secondary | ICD-10-CM | POA: Diagnosis not present

## 2019-03-25 DIAGNOSIS — D509 Iron deficiency anemia, unspecified: Secondary | ICD-10-CM | POA: Diagnosis not present

## 2019-03-25 DIAGNOSIS — Z794 Long term (current) use of insulin: Secondary | ICD-10-CM | POA: Diagnosis not present

## 2019-03-25 DIAGNOSIS — F3342 Major depressive disorder, recurrent, in full remission: Secondary | ICD-10-CM | POA: Diagnosis not present

## 2019-03-25 DIAGNOSIS — I10 Essential (primary) hypertension: Secondary | ICD-10-CM | POA: Diagnosis not present

## 2019-03-25 DIAGNOSIS — E7849 Other hyperlipidemia: Secondary | ICD-10-CM | POA: Diagnosis not present

## 2019-03-25 DIAGNOSIS — K219 Gastro-esophageal reflux disease without esophagitis: Secondary | ICD-10-CM | POA: Diagnosis not present

## 2019-03-25 DIAGNOSIS — J4520 Mild intermittent asthma, uncomplicated: Secondary | ICD-10-CM | POA: Diagnosis not present

## 2019-06-08 IMAGING — CR DG RIBS W/ CHEST 3+V*L*
4 series · 5 of 5 positions shown · non-contrast
Comparison: None.

CLINICAL DATA: LEFT rib pain after fall today.  History of COPD.

EXAM:
LEFT RIBS AND CHEST - 3+ VIEW

[chest pa]
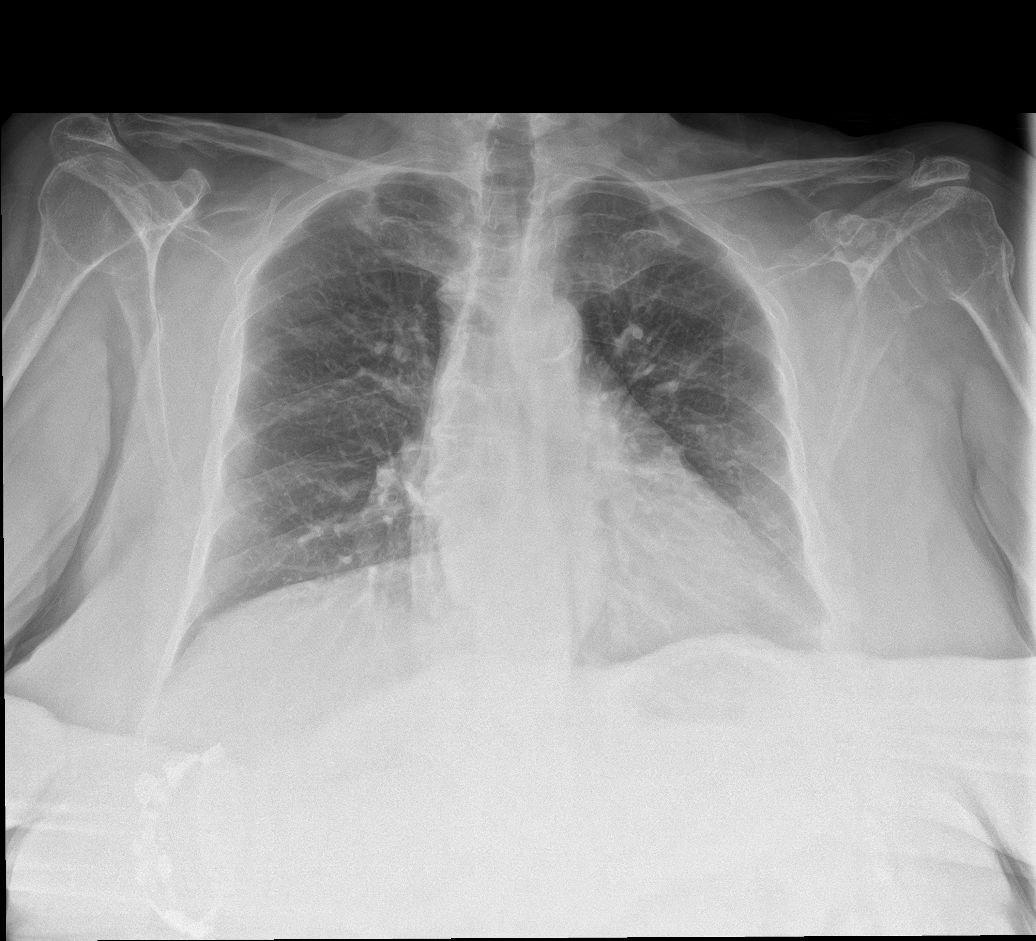

[rib pa]
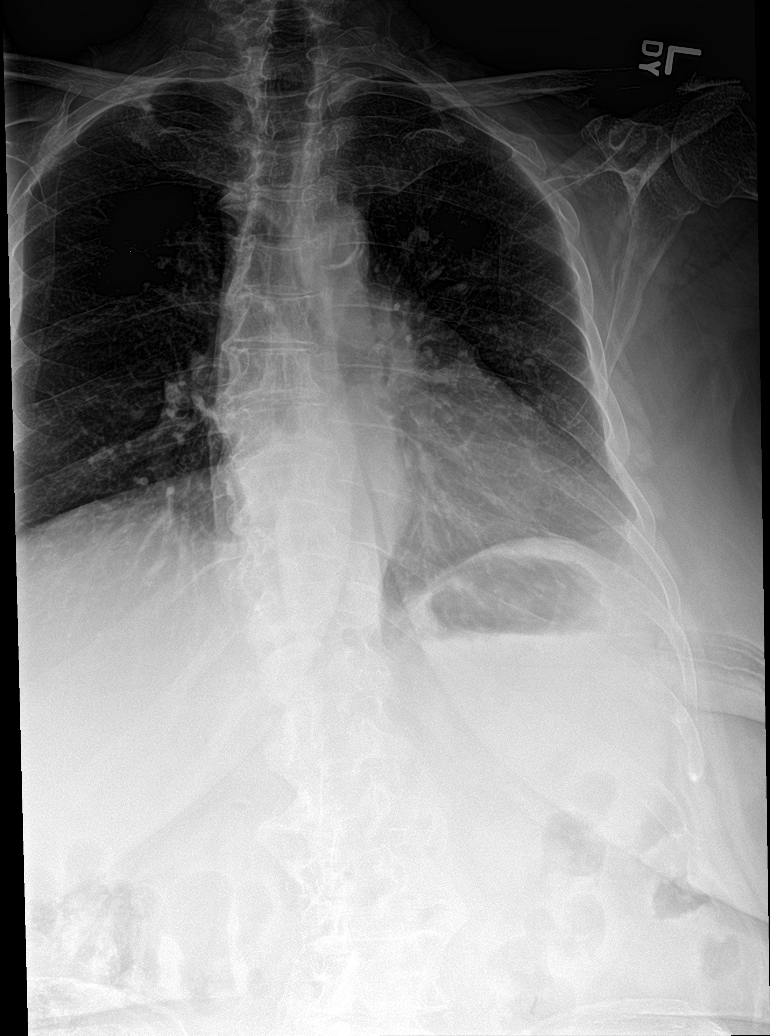

[Series 4: rib ap · 0.14mm/px · 2 of 2 slices shown]
[im 1/2]
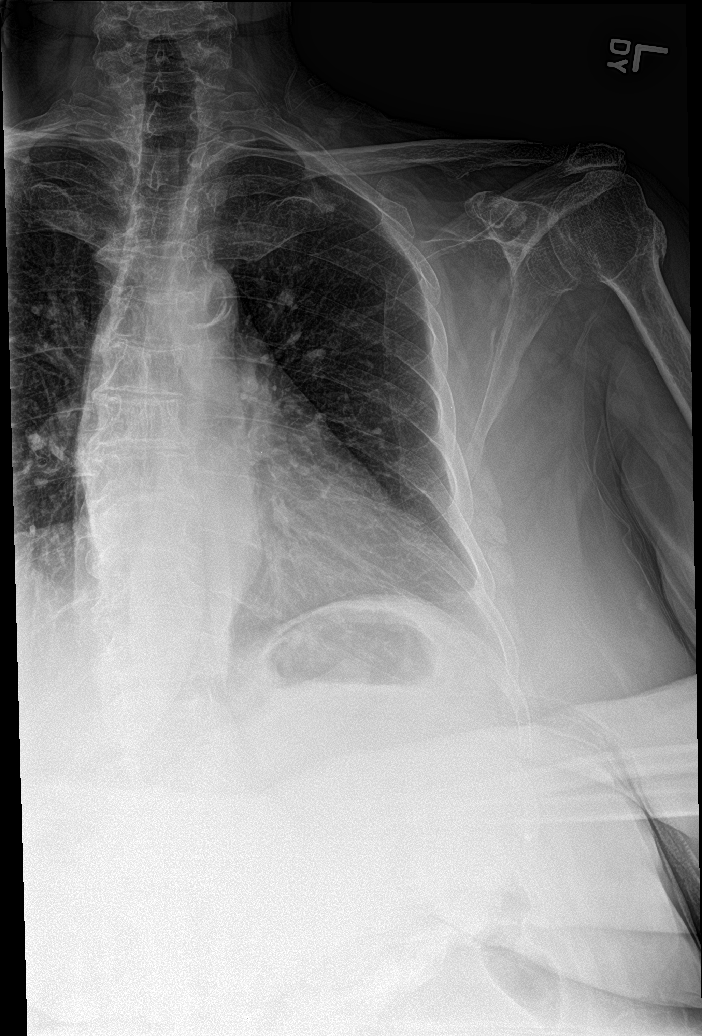
[im 2/2]
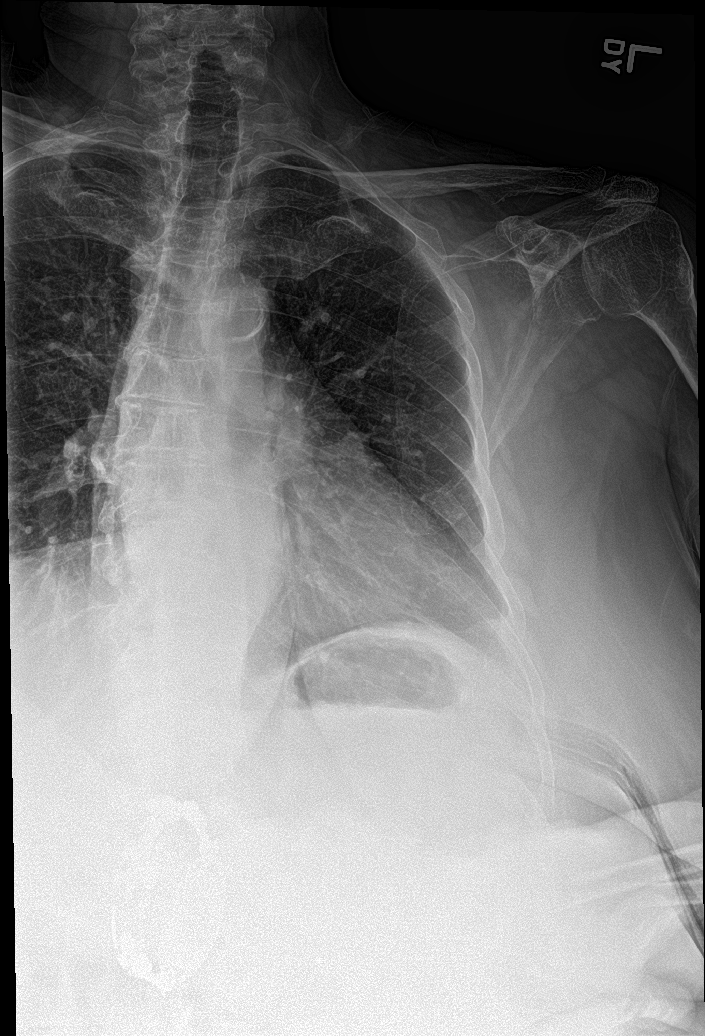

[rib ap obl]
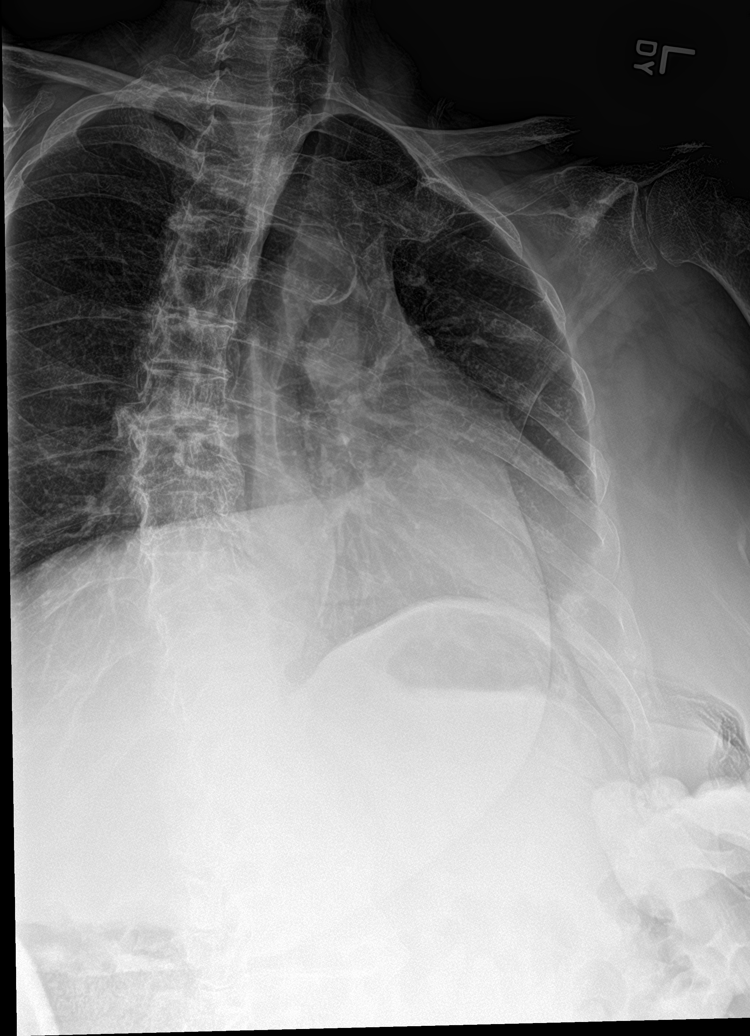

[5 of 5 positions shown; findings below may reference images not displayed]

FINDINGS: Cardiac silhouette is mildly enlarged. Calcified aortic knob. Mild
chronic interstitial changes without pleural effusion or focal
consolidation. No pneumothorax. Osteopenia. No rib fracture
deformity. Mild lower thoracic suspected compression fractures.
IMPRESSION: Mild cardiomegaly, no acute pulmonary process. No acute rib fracture
deformity.

Aortic Atherosclerosis (9D2U5-9H3.3).

## 2019-06-08 IMAGING — DX DG WRIST COMPLETE 3+V*L*
3 series · 3 of 3 positions shown · non-contrast
Comparison: 03/26/2017

CLINICAL DATA: Post reduction left wrist fx

EXAM:
LEFT WRIST - COMPLETE 3+ VIEW

[wrist ap]
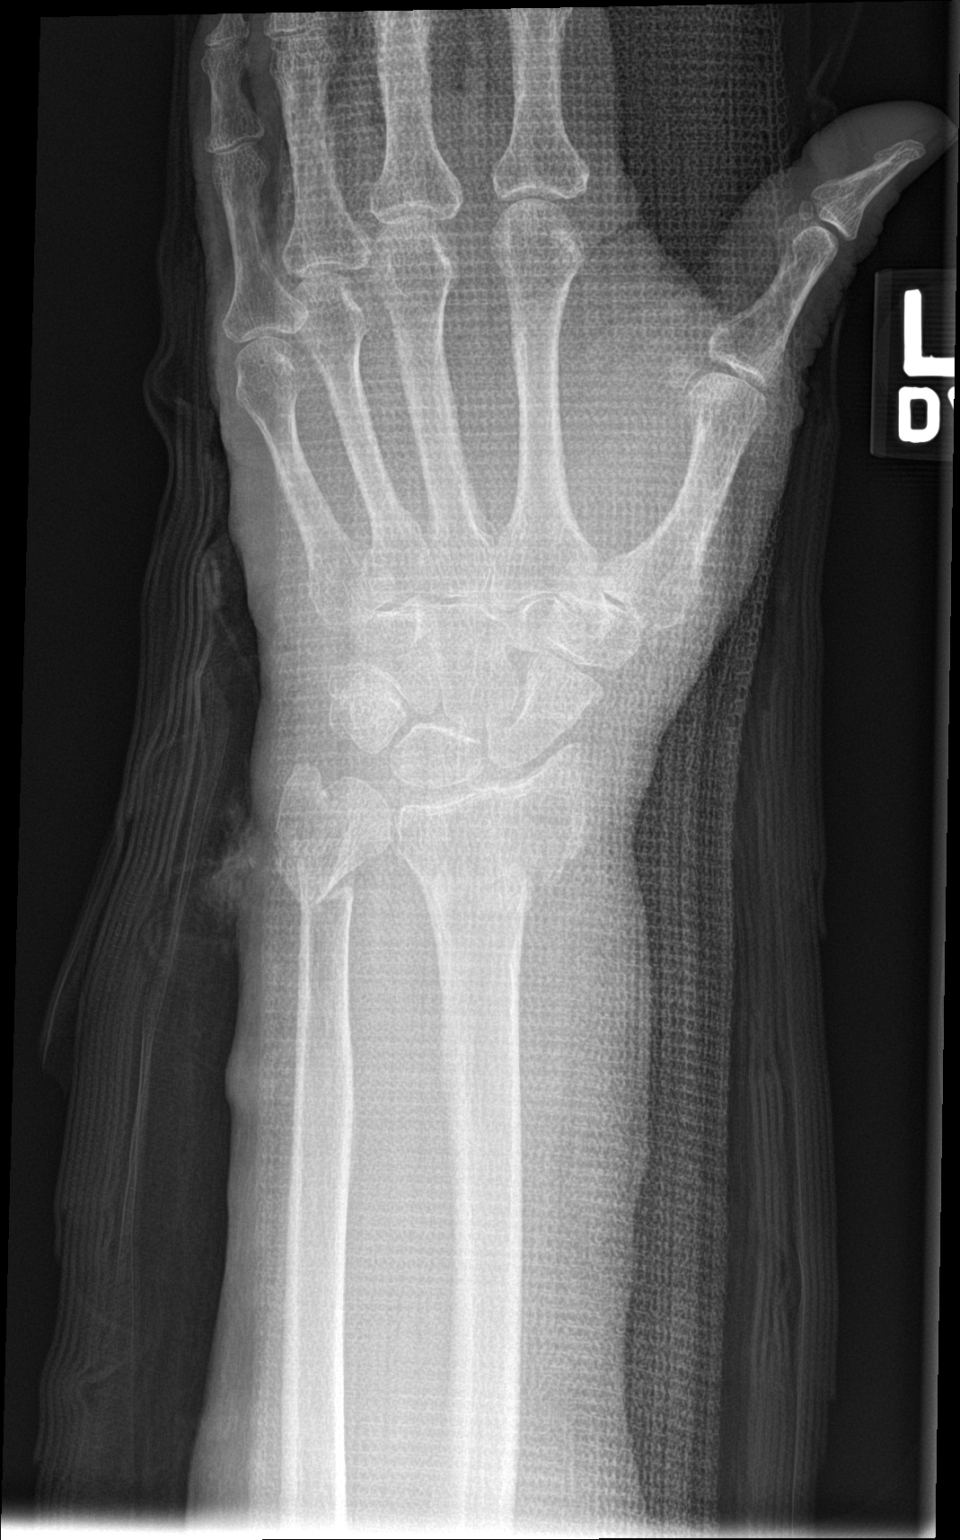

[wrist obl]
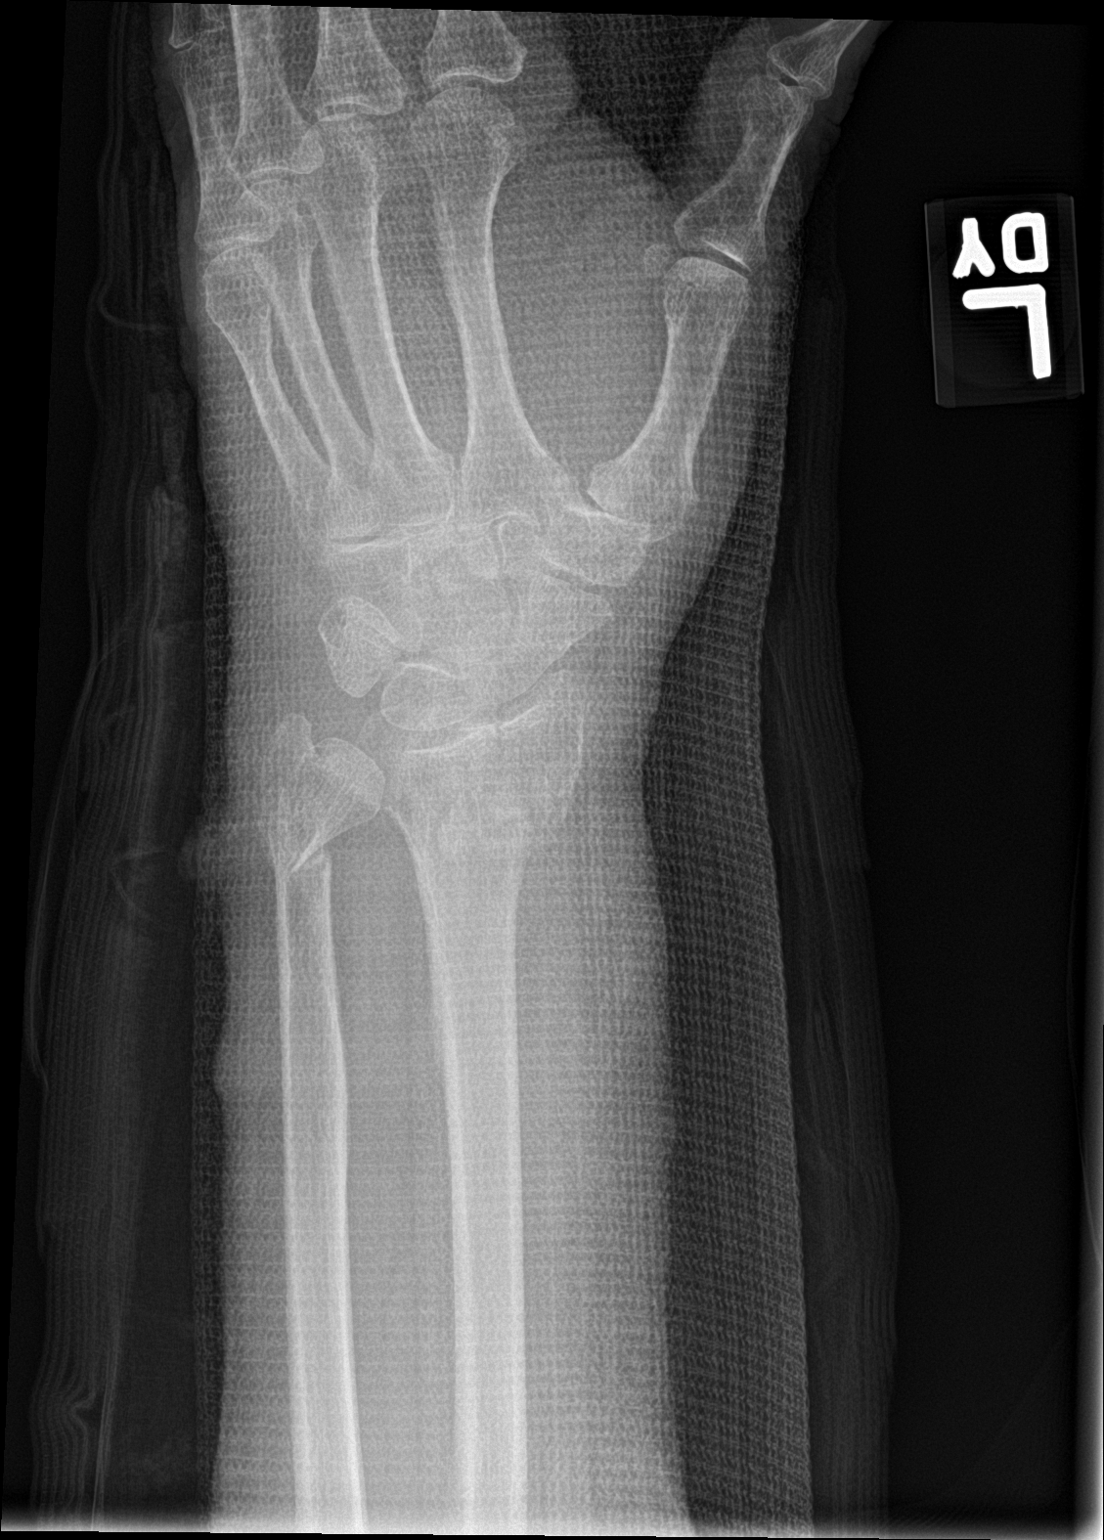

[wrist lat]
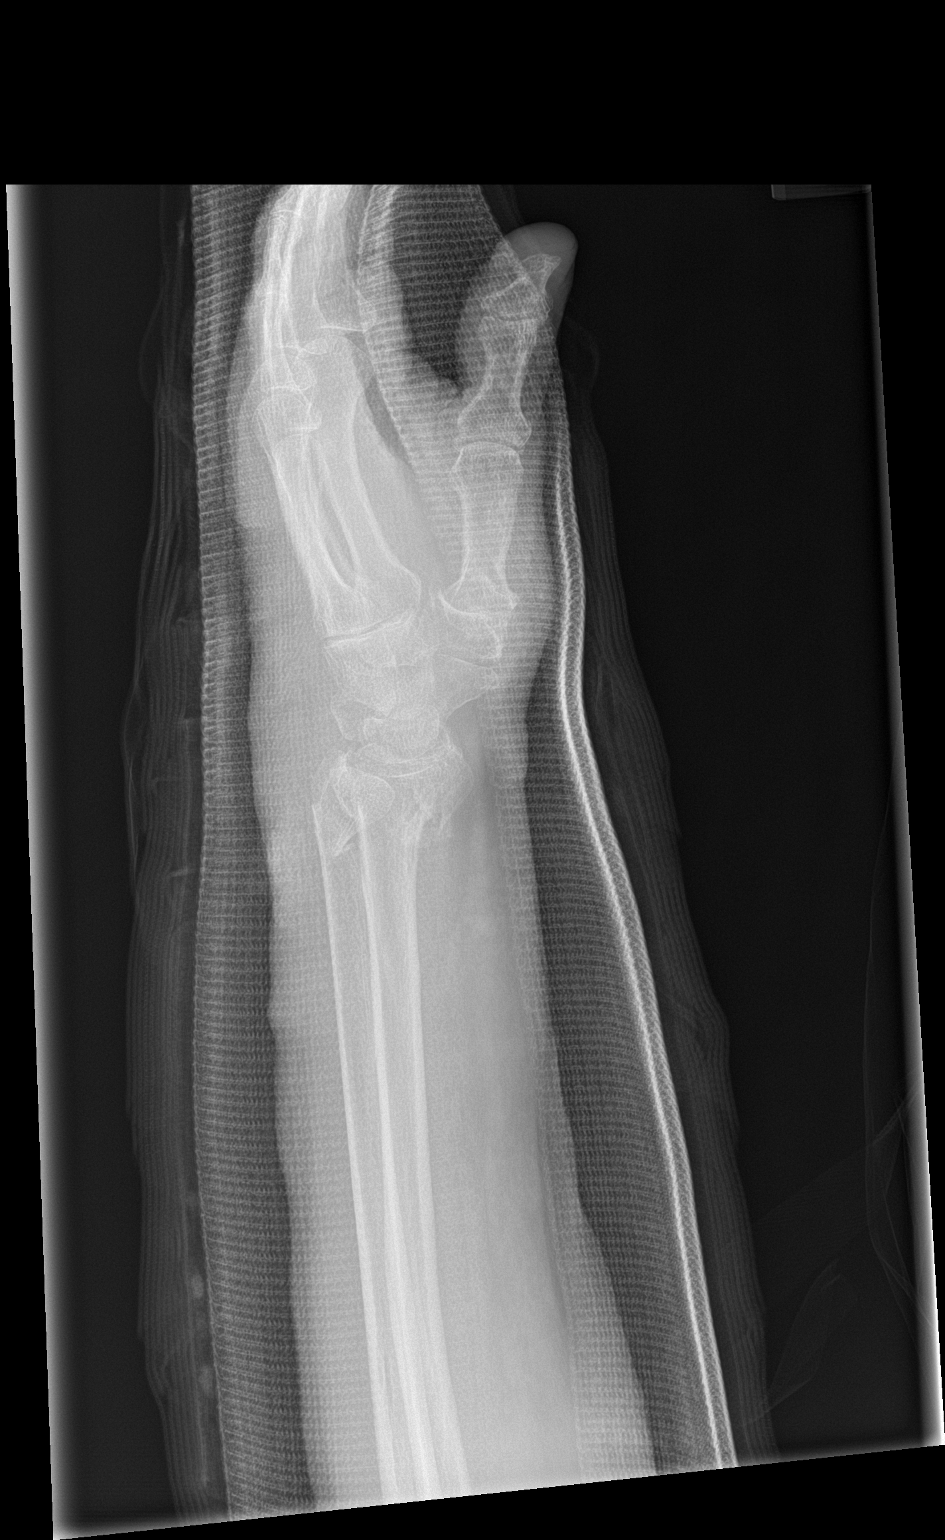

[3 of 3 positions shown; findings below may reference images not displayed]

FINDINGS: Wrist is imaged through splinting material. Status post reduction of
distal radius and ulnar fractures with improved alignment. No new
fractures are identified.
IMPRESSION: Improved alignment following reduction.

## 2019-06-08 IMAGING — DX DG WRIST COMPLETE 3+V*L*
3 series · 3 of 3 positions shown · non-contrast
Comparison: None.

CLINICAL DATA: Two Pt states that she was walking into her home and
thinks she may have rolled her ankle causing her to fall and hit the
left side of her head, left arm, left knee, obvious deformity of
left wrist. Best images obtained.

EXAM:
LEFT WRIST - COMPLETE 3+ VIEW

[wrist ap]
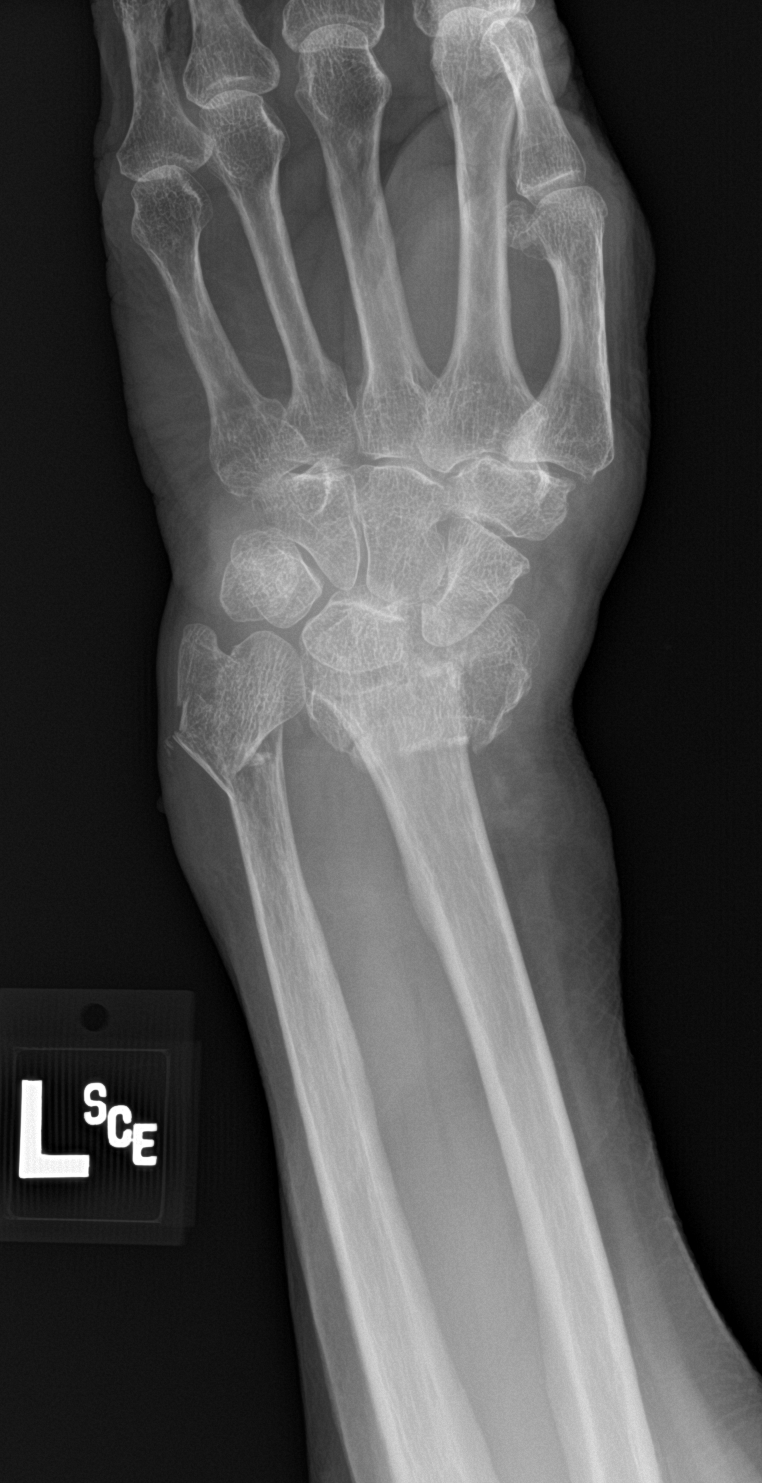

[wrist obl]
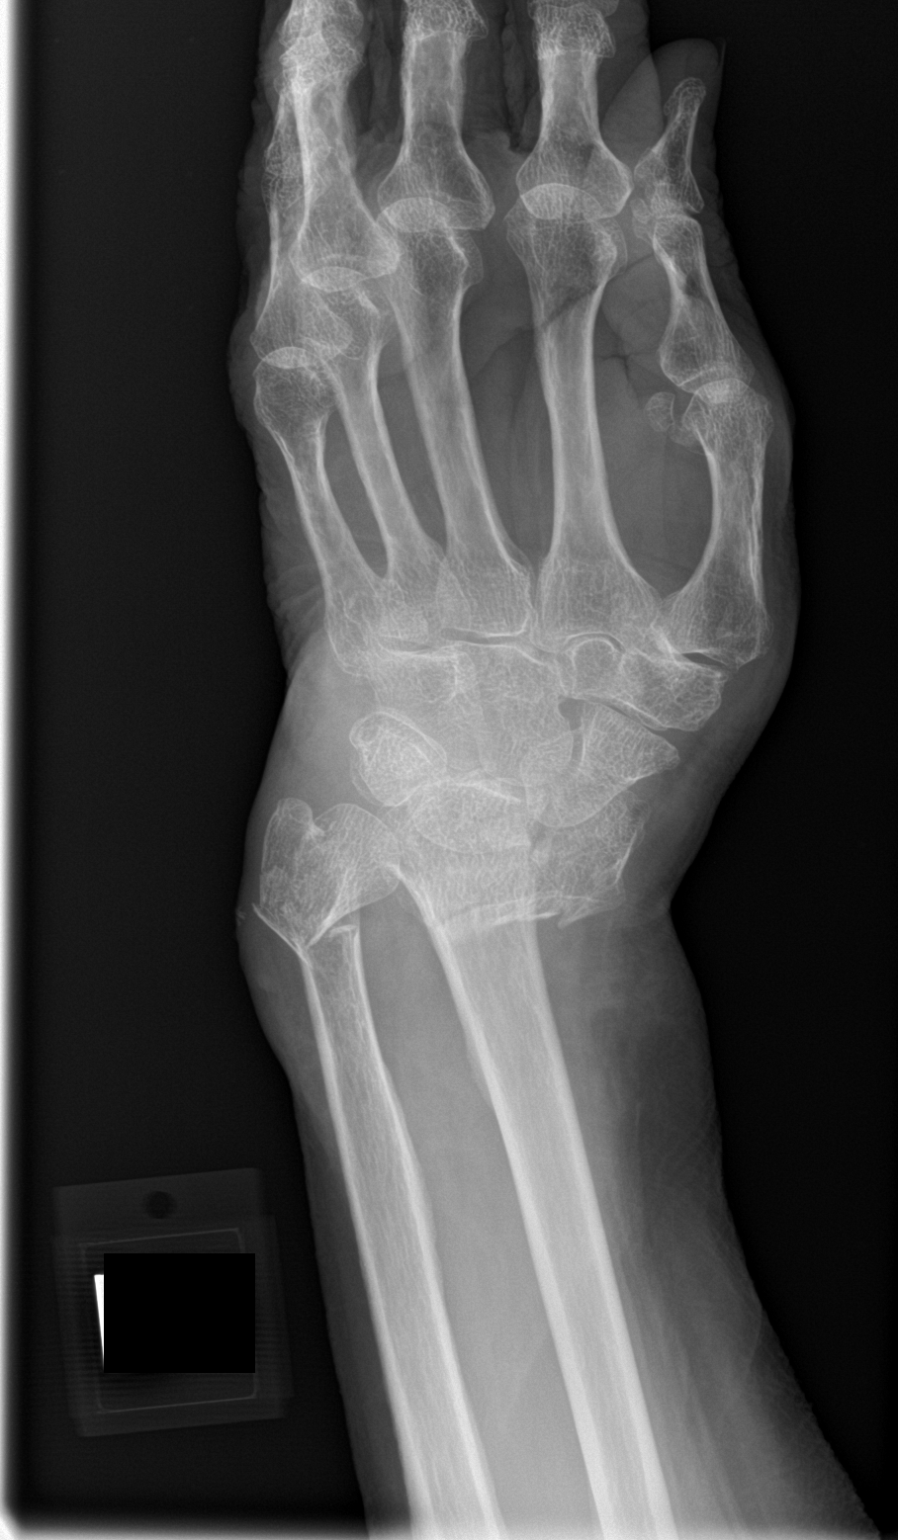

[wrist lat]
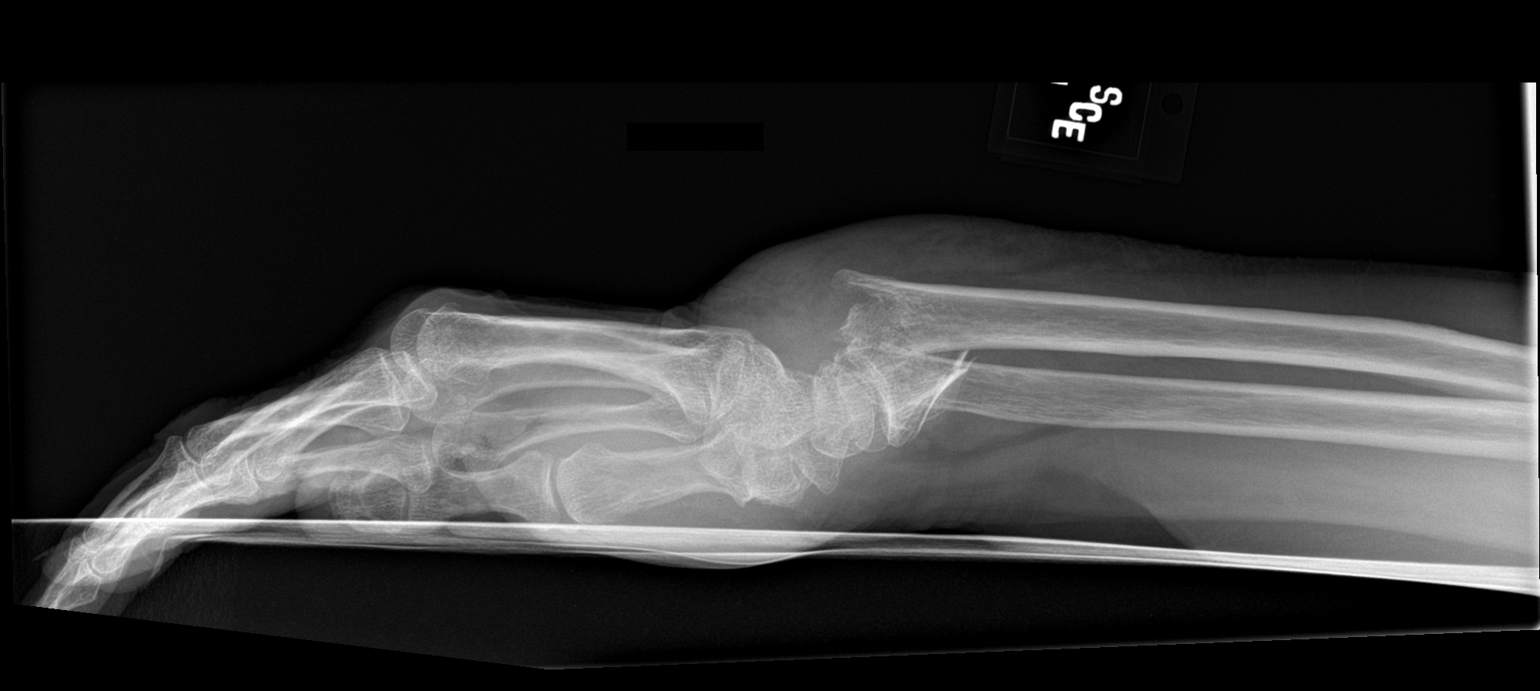

[3 of 3 positions shown; findings below may reference images not displayed]

FINDINGS: Displaced fracture of the distal radius. Radial epiphysis is
displaced 1 bone width ventrally in relation to the radial
metaphysis. The radiocarpal joint remains intact.

Fracture of the ulna at the metaphysis

Significant joint soft tissue swelling dorsal to the wrist joint. No
carpal fracture
IMPRESSION: 1. Fracture of the distal radius with 1 bone width ventral
displacement.

2. Distal ulnar metaphyseal fracture.

## 2019-07-01 DIAGNOSIS — E1122 Type 2 diabetes mellitus with diabetic chronic kidney disease: Secondary | ICD-10-CM | POA: Diagnosis not present

## 2019-07-01 DIAGNOSIS — Z794 Long term (current) use of insulin: Secondary | ICD-10-CM | POA: Diagnosis not present

## 2019-07-01 DIAGNOSIS — N183 Chronic kidney disease, stage 3 unspecified: Secondary | ICD-10-CM | POA: Diagnosis not present

## 2019-07-08 DIAGNOSIS — Z794 Long term (current) use of insulin: Secondary | ICD-10-CM | POA: Diagnosis not present

## 2019-07-08 DIAGNOSIS — E1165 Type 2 diabetes mellitus with hyperglycemia: Secondary | ICD-10-CM | POA: Diagnosis not present

## 2019-08-26 DIAGNOSIS — G4733 Obstructive sleep apnea (adult) (pediatric): Secondary | ICD-10-CM | POA: Diagnosis not present

## 2019-08-26 DIAGNOSIS — J4520 Mild intermittent asthma, uncomplicated: Secondary | ICD-10-CM | POA: Diagnosis not present

## 2019-09-18 DIAGNOSIS — E538 Deficiency of other specified B group vitamins: Secondary | ICD-10-CM | POA: Diagnosis not present

## 2019-09-18 DIAGNOSIS — E1165 Type 2 diabetes mellitus with hyperglycemia: Secondary | ICD-10-CM | POA: Diagnosis not present

## 2019-09-18 DIAGNOSIS — E7849 Other hyperlipidemia: Secondary | ICD-10-CM | POA: Diagnosis not present

## 2019-09-18 DIAGNOSIS — I10 Essential (primary) hypertension: Secondary | ICD-10-CM | POA: Diagnosis not present

## 2019-09-18 DIAGNOSIS — D509 Iron deficiency anemia, unspecified: Secondary | ICD-10-CM | POA: Diagnosis not present

## 2019-09-18 DIAGNOSIS — G4733 Obstructive sleep apnea (adult) (pediatric): Secondary | ICD-10-CM | POA: Diagnosis not present

## 2019-09-25 DIAGNOSIS — I351 Nonrheumatic aortic (valve) insufficiency: Secondary | ICD-10-CM | POA: Diagnosis not present

## 2019-09-25 DIAGNOSIS — F3342 Major depressive disorder, recurrent, in full remission: Secondary | ICD-10-CM | POA: Diagnosis not present

## 2019-09-25 DIAGNOSIS — E538 Deficiency of other specified B group vitamins: Secondary | ICD-10-CM | POA: Diagnosis not present

## 2019-09-25 DIAGNOSIS — E1169 Type 2 diabetes mellitus with other specified complication: Secondary | ICD-10-CM | POA: Diagnosis not present

## 2019-09-25 DIAGNOSIS — K219 Gastro-esophageal reflux disease without esophagitis: Secondary | ICD-10-CM | POA: Diagnosis not present

## 2019-09-25 DIAGNOSIS — Z0000 Encounter for general adult medical examination without abnormal findings: Secondary | ICD-10-CM | POA: Diagnosis not present

## 2019-09-25 DIAGNOSIS — M810 Age-related osteoporosis without current pathological fracture: Secondary | ICD-10-CM | POA: Diagnosis not present

## 2019-09-25 DIAGNOSIS — E1165 Type 2 diabetes mellitus with hyperglycemia: Secondary | ICD-10-CM | POA: Diagnosis not present

## 2019-09-25 DIAGNOSIS — G4733 Obstructive sleep apnea (adult) (pediatric): Secondary | ICD-10-CM | POA: Diagnosis not present

## 2019-09-25 DIAGNOSIS — E7849 Other hyperlipidemia: Secondary | ICD-10-CM | POA: Diagnosis not present

## 2019-09-25 DIAGNOSIS — D509 Iron deficiency anemia, unspecified: Secondary | ICD-10-CM | POA: Diagnosis not present

## 2019-09-25 DIAGNOSIS — I10 Essential (primary) hypertension: Secondary | ICD-10-CM | POA: Diagnosis not present

## 2019-09-30 DIAGNOSIS — M810 Age-related osteoporosis without current pathological fracture: Secondary | ICD-10-CM | POA: Diagnosis not present

## 2019-10-09 DIAGNOSIS — Z794 Long term (current) use of insulin: Secondary | ICD-10-CM | POA: Diagnosis not present

## 2019-10-09 DIAGNOSIS — E1165 Type 2 diabetes mellitus with hyperglycemia: Secondary | ICD-10-CM | POA: Diagnosis not present

## 2019-10-22 DIAGNOSIS — E1165 Type 2 diabetes mellitus with hyperglycemia: Secondary | ICD-10-CM | POA: Diagnosis not present

## 2019-10-22 DIAGNOSIS — Z794 Long term (current) use of insulin: Secondary | ICD-10-CM | POA: Diagnosis not present

## 2019-11-08 ENCOUNTER — Other Ambulatory Visit: Payer: Self-pay | Admitting: Internal Medicine

## 2019-11-08 DIAGNOSIS — Z1231 Encounter for screening mammogram for malignant neoplasm of breast: Secondary | ICD-10-CM

## 2019-12-23 ENCOUNTER — Ambulatory Visit
Admission: RE | Admit: 2019-12-23 | Discharge: 2019-12-23 | Disposition: A | Discharge status: Home / Self Care | Payer: PPO | Source: Ambulatory Visit | Attending: Internal Medicine | Admitting: Internal Medicine

## 2019-12-23 DIAGNOSIS — Z1231 Encounter for screening mammogram for malignant neoplasm of breast: Secondary | ICD-10-CM | POA: Diagnosis not present

## 2020-01-08 DIAGNOSIS — H353131 Nonexudative age-related macular degeneration, bilateral, early dry stage: Secondary | ICD-10-CM | POA: Diagnosis not present

## 2020-01-29 DIAGNOSIS — E1169 Type 2 diabetes mellitus with other specified complication: Secondary | ICD-10-CM | POA: Diagnosis not present

## 2020-01-29 DIAGNOSIS — Z794 Long term (current) use of insulin: Secondary | ICD-10-CM | POA: Diagnosis not present

## 2020-01-29 DIAGNOSIS — E1165 Type 2 diabetes mellitus with hyperglycemia: Secondary | ICD-10-CM | POA: Diagnosis not present

## 2020-01-29 DIAGNOSIS — E669 Obesity, unspecified: Secondary | ICD-10-CM | POA: Diagnosis not present

## 2020-03-02 DIAGNOSIS — J4520 Mild intermittent asthma, uncomplicated: Secondary | ICD-10-CM | POA: Diagnosis not present

## 2020-03-02 DIAGNOSIS — G4733 Obstructive sleep apnea (adult) (pediatric): Secondary | ICD-10-CM | POA: Diagnosis not present

## 2020-03-02 DIAGNOSIS — R0600 Dyspnea, unspecified: Secondary | ICD-10-CM | POA: Diagnosis not present

## 2020-03-20 DIAGNOSIS — D509 Iron deficiency anemia, unspecified: Secondary | ICD-10-CM | POA: Diagnosis not present

## 2020-03-20 DIAGNOSIS — E538 Deficiency of other specified B group vitamins: Secondary | ICD-10-CM | POA: Diagnosis not present

## 2020-03-20 DIAGNOSIS — E1169 Type 2 diabetes mellitus with other specified complication: Secondary | ICD-10-CM | POA: Diagnosis not present

## 2020-03-20 DIAGNOSIS — E669 Obesity, unspecified: Secondary | ICD-10-CM | POA: Diagnosis not present

## 2020-03-20 DIAGNOSIS — E7849 Other hyperlipidemia: Secondary | ICD-10-CM | POA: Diagnosis not present

## 2020-03-26 DIAGNOSIS — I10 Essential (primary) hypertension: Secondary | ICD-10-CM | POA: Diagnosis not present

## 2020-03-26 DIAGNOSIS — E1165 Type 2 diabetes mellitus with hyperglycemia: Secondary | ICD-10-CM | POA: Diagnosis not present

## 2020-03-26 DIAGNOSIS — K219 Gastro-esophageal reflux disease without esophagitis: Secondary | ICD-10-CM | POA: Diagnosis not present

## 2020-03-26 DIAGNOSIS — D509 Iron deficiency anemia, unspecified: Secondary | ICD-10-CM | POA: Diagnosis not present

## 2020-03-26 DIAGNOSIS — E538 Deficiency of other specified B group vitamins: Secondary | ICD-10-CM | POA: Diagnosis not present

## 2020-03-26 DIAGNOSIS — F3342 Major depressive disorder, recurrent, in full remission: Secondary | ICD-10-CM | POA: Diagnosis not present

## 2020-03-26 DIAGNOSIS — E669 Obesity, unspecified: Secondary | ICD-10-CM | POA: Diagnosis not present

## 2020-03-26 DIAGNOSIS — E1169 Type 2 diabetes mellitus with other specified complication: Secondary | ICD-10-CM | POA: Diagnosis not present

## 2020-03-26 DIAGNOSIS — E7849 Other hyperlipidemia: Secondary | ICD-10-CM | POA: Diagnosis not present

## 2020-03-26 DIAGNOSIS — G4733 Obstructive sleep apnea (adult) (pediatric): Secondary | ICD-10-CM | POA: Diagnosis not present

## 2020-03-26 DIAGNOSIS — Z9989 Dependence on other enabling machines and devices: Secondary | ICD-10-CM | POA: Diagnosis not present

## 2020-06-25 DIAGNOSIS — E1165 Type 2 diabetes mellitus with hyperglycemia: Secondary | ICD-10-CM | POA: Diagnosis not present

## 2020-06-25 DIAGNOSIS — Z794 Long term (current) use of insulin: Secondary | ICD-10-CM | POA: Diagnosis not present

## 2020-07-07 ENCOUNTER — Ambulatory Visit: Payer: Medicare Other

## 2020-09-21 DIAGNOSIS — E669 Obesity, unspecified: Secondary | ICD-10-CM | POA: Diagnosis not present

## 2020-09-21 DIAGNOSIS — E538 Deficiency of other specified B group vitamins: Secondary | ICD-10-CM | POA: Diagnosis not present

## 2020-09-21 DIAGNOSIS — E1169 Type 2 diabetes mellitus with other specified complication: Secondary | ICD-10-CM | POA: Diagnosis not present

## 2020-09-21 DIAGNOSIS — E7849 Other hyperlipidemia: Secondary | ICD-10-CM | POA: Diagnosis not present

## 2020-09-28 DIAGNOSIS — I10 Essential (primary) hypertension: Secondary | ICD-10-CM | POA: Diagnosis not present

## 2020-09-28 DIAGNOSIS — E1169 Type 2 diabetes mellitus with other specified complication: Secondary | ICD-10-CM | POA: Diagnosis not present

## 2020-09-28 DIAGNOSIS — E7849 Other hyperlipidemia: Secondary | ICD-10-CM | POA: Diagnosis not present

## 2020-09-28 DIAGNOSIS — D509 Iron deficiency anemia, unspecified: Secondary | ICD-10-CM | POA: Diagnosis not present

## 2020-09-28 DIAGNOSIS — F3342 Major depressive disorder, recurrent, in full remission: Secondary | ICD-10-CM | POA: Diagnosis not present

## 2020-09-28 DIAGNOSIS — K219 Gastro-esophageal reflux disease without esophagitis: Secondary | ICD-10-CM | POA: Diagnosis not present

## 2020-09-28 DIAGNOSIS — Z794 Long term (current) use of insulin: Secondary | ICD-10-CM | POA: Diagnosis not present

## 2020-09-28 DIAGNOSIS — I351 Nonrheumatic aortic (valve) insufficiency: Secondary | ICD-10-CM | POA: Diagnosis not present

## 2020-09-28 DIAGNOSIS — E1165 Type 2 diabetes mellitus with hyperglycemia: Secondary | ICD-10-CM | POA: Diagnosis not present

## 2020-09-28 DIAGNOSIS — G4733 Obstructive sleep apnea (adult) (pediatric): Secondary | ICD-10-CM | POA: Diagnosis not present

## 2020-09-28 DIAGNOSIS — E538 Deficiency of other specified B group vitamins: Secondary | ICD-10-CM | POA: Diagnosis not present

## 2020-12-23 DIAGNOSIS — E1165 Type 2 diabetes mellitus with hyperglycemia: Secondary | ICD-10-CM | POA: Diagnosis not present

## 2020-12-23 DIAGNOSIS — Z794 Long term (current) use of insulin: Secondary | ICD-10-CM | POA: Diagnosis not present

## 2020-12-30 DIAGNOSIS — E669 Obesity, unspecified: Secondary | ICD-10-CM | POA: Diagnosis not present

## 2020-12-30 DIAGNOSIS — Z794 Long term (current) use of insulin: Secondary | ICD-10-CM | POA: Diagnosis not present

## 2020-12-30 DIAGNOSIS — E1169 Type 2 diabetes mellitus with other specified complication: Secondary | ICD-10-CM | POA: Diagnosis not present

## 2020-12-30 DIAGNOSIS — E1165 Type 2 diabetes mellitus with hyperglycemia: Secondary | ICD-10-CM | POA: Diagnosis not present

## 2021-01-13 DIAGNOSIS — H353131 Nonexudative age-related macular degeneration, bilateral, early dry stage: Secondary | ICD-10-CM | POA: Diagnosis not present

## 2021-01-25 DIAGNOSIS — E669 Obesity, unspecified: Secondary | ICD-10-CM | POA: Diagnosis not present

## 2021-01-25 DIAGNOSIS — E1169 Type 2 diabetes mellitus with other specified complication: Secondary | ICD-10-CM | POA: Diagnosis not present

## 2021-01-25 DIAGNOSIS — E7849 Other hyperlipidemia: Secondary | ICD-10-CM | POA: Diagnosis not present

## 2021-01-29 ENCOUNTER — Other Ambulatory Visit: Payer: Self-pay | Admitting: Internal Medicine

## 2021-01-29 DIAGNOSIS — Z1231 Encounter for screening mammogram for malignant neoplasm of breast: Secondary | ICD-10-CM

## 2021-02-01 ENCOUNTER — Ambulatory Visit
Admission: RE | Admit: 2021-02-01 | Discharge: 2021-02-01 | Disposition: A | Discharge status: Home / Self Care | Payer: PPO | Source: Ambulatory Visit | Attending: Internal Medicine | Admitting: Internal Medicine

## 2021-02-01 ENCOUNTER — Other Ambulatory Visit: Payer: Self-pay

## 2021-02-01 DIAGNOSIS — Z1231 Encounter for screening mammogram for malignant neoplasm of breast: Secondary | ICD-10-CM | POA: Diagnosis not present

## 2021-02-01 DIAGNOSIS — K219 Gastro-esophageal reflux disease without esophagitis: Secondary | ICD-10-CM | POA: Diagnosis not present

## 2021-02-01 DIAGNOSIS — G4733 Obstructive sleep apnea (adult) (pediatric): Secondary | ICD-10-CM | POA: Diagnosis not present

## 2021-02-01 DIAGNOSIS — E7849 Other hyperlipidemia: Secondary | ICD-10-CM | POA: Diagnosis not present

## 2021-02-01 DIAGNOSIS — E1169 Type 2 diabetes mellitus with other specified complication: Secondary | ICD-10-CM | POA: Diagnosis not present

## 2021-02-01 DIAGNOSIS — D509 Iron deficiency anemia, unspecified: Secondary | ICD-10-CM | POA: Diagnosis not present

## 2021-02-01 DIAGNOSIS — Z0000 Encounter for general adult medical examination without abnormal findings: Secondary | ICD-10-CM | POA: Diagnosis not present

## 2021-02-01 DIAGNOSIS — I10 Essential (primary) hypertension: Secondary | ICD-10-CM | POA: Diagnosis not present

## 2021-02-01 DIAGNOSIS — E538 Deficiency of other specified B group vitamins: Secondary | ICD-10-CM | POA: Diagnosis not present

## 2021-02-01 DIAGNOSIS — F3342 Major depressive disorder, recurrent, in full remission: Secondary | ICD-10-CM | POA: Diagnosis not present

## 2021-02-01 DIAGNOSIS — E1165 Type 2 diabetes mellitus with hyperglycemia: Secondary | ICD-10-CM | POA: Diagnosis not present

## 2021-02-01 DIAGNOSIS — Z1211 Encounter for screening for malignant neoplasm of colon: Secondary | ICD-10-CM | POA: Diagnosis not present

## 2021-02-02 ENCOUNTER — Ambulatory Visit: Payer: PPO | Attending: Internal Medicine

## 2021-02-02 ENCOUNTER — Other Ambulatory Visit: Payer: Self-pay

## 2021-02-02 DIAGNOSIS — Z23 Encounter for immunization: Secondary | ICD-10-CM

## 2021-02-02 MED ORDER — PFIZER-BIONT COVID-19 VAC-TRIS 30 MCG/0.3ML IM SUSP
INTRAMUSCULAR | 0 refills | Status: DC
  Filled 2021-02-02: qty 0.3, 1d supply, fill #0

## 2021-02-02 NOTE — Progress Notes (Signed)
   Covid-19 Vaccination Clinic  Name:  ABYGAYLE DELTORO    MRN: [id number] DOB: 1946-08-02  02/02/2021  Ms. Swartzlander was observed post Covid-19 immunization for 15 minutes without incident. She was provided with Vaccine Information Sheet and instruction to access the V-Safe system.   Ms. Piltz was instructed to call 911 with any severe reactions post vaccine: Difficulty breathing  Swelling of face and throat  A fast heartbeat  A bad rash all over body  Dizziness and weakness   Immunizations Administered     Name Date Dose VIS Date Route   PFIZER Comrnaty(Gray TOP) Covid-19 Vaccine 02/02/2021  9:32 AM 0.3 mL 07/30/2020 Intramuscular   Manufacturer: [employer], [employer]   Lot: [id number]   NDC: [phone]

## 2021-02-27 DIAGNOSIS — Z1211 Encounter for screening for malignant neoplasm of colon: Secondary | ICD-10-CM | POA: Diagnosis not present

## 2021-03-04 LAB — COLOGUARD: COLOGUARD: NEGATIVE

## 2021-03-19 DIAGNOSIS — I739 Peripheral vascular disease, unspecified: Secondary | ICD-10-CM | POA: Diagnosis not present

## 2021-04-06 DIAGNOSIS — G4733 Obstructive sleep apnea (adult) (pediatric): Secondary | ICD-10-CM | POA: Diagnosis not present

## 2021-04-06 DIAGNOSIS — J310 Chronic rhinitis: Secondary | ICD-10-CM | POA: Diagnosis not present

## 2021-04-06 DIAGNOSIS — J449 Chronic obstructive pulmonary disease, unspecified: Secondary | ICD-10-CM | POA: Diagnosis not present

## 2021-05-18 ENCOUNTER — Ambulatory Visit: Payer: PPO | Attending: Internal Medicine

## 2021-05-18 ENCOUNTER — Other Ambulatory Visit: Payer: Self-pay

## 2021-05-18 DIAGNOSIS — Z23 Encounter for immunization: Secondary | ICD-10-CM

## 2021-05-18 MED ORDER — PFIZER COVID-19 VAC BIVALENT 30 MCG/0.3ML IM SUSP
INTRAMUSCULAR | 0 refills | Status: DC
  Filled 2021-05-18: qty 0.3, 1d supply, fill #0

## 2021-05-18 NOTE — Progress Notes (Signed)
   Covid-19 Vaccination Clinic  Name:  Isabella White    MRN: [id number] DOB: [birth year]  05/18/2021  Isabella White was observed post Covid-19 immunization for 15 minutes without incident. She was provided with Vaccine Information Sheet and instruction to access the V-Safe system.   Isabella White was instructed to call 911 with any severe reactions post vaccine: Difficulty breathing  Swelling of face and throat  A fast heartbeat  A bad rash all over body  Dizziness and weakness   Drusilla Kanner, PharmD, MBA Clinical Acute Care Pharmacist

## 2021-06-29 DIAGNOSIS — E1169 Type 2 diabetes mellitus with other specified complication: Secondary | ICD-10-CM | POA: Diagnosis not present

## 2021-06-29 DIAGNOSIS — E1165 Type 2 diabetes mellitus with hyperglycemia: Secondary | ICD-10-CM | POA: Diagnosis not present

## 2021-06-29 DIAGNOSIS — E7849 Other hyperlipidemia: Secondary | ICD-10-CM | POA: Diagnosis not present

## 2021-06-29 DIAGNOSIS — E669 Obesity, unspecified: Secondary | ICD-10-CM | POA: Diagnosis not present

## 2021-06-29 DIAGNOSIS — E538 Deficiency of other specified B group vitamins: Secondary | ICD-10-CM | POA: Diagnosis not present

## 2021-06-29 DIAGNOSIS — Z794 Long term (current) use of insulin: Secondary | ICD-10-CM | POA: Diagnosis not present

## 2021-06-29 DIAGNOSIS — D509 Iron deficiency anemia, unspecified: Secondary | ICD-10-CM | POA: Diagnosis not present

## 2021-07-06 DIAGNOSIS — E1169 Type 2 diabetes mellitus with other specified complication: Secondary | ICD-10-CM | POA: Diagnosis not present

## 2021-07-06 DIAGNOSIS — I10 Essential (primary) hypertension: Secondary | ICD-10-CM | POA: Diagnosis not present

## 2021-07-06 DIAGNOSIS — F3342 Major depressive disorder, recurrent, in full remission: Secondary | ICD-10-CM | POA: Diagnosis not present

## 2021-07-06 DIAGNOSIS — Z794 Long term (current) use of insulin: Secondary | ICD-10-CM | POA: Diagnosis not present

## 2021-07-06 DIAGNOSIS — G4733 Obstructive sleep apnea (adult) (pediatric): Secondary | ICD-10-CM | POA: Diagnosis not present

## 2021-07-06 DIAGNOSIS — E7849 Other hyperlipidemia: Secondary | ICD-10-CM | POA: Diagnosis not present

## 2021-07-06 DIAGNOSIS — E669 Obesity, unspecified: Secondary | ICD-10-CM | POA: Diagnosis not present

## 2021-07-06 DIAGNOSIS — D509 Iron deficiency anemia, unspecified: Secondary | ICD-10-CM | POA: Diagnosis not present

## 2021-07-06 DIAGNOSIS — E119 Type 2 diabetes mellitus without complications: Secondary | ICD-10-CM | POA: Diagnosis not present

## 2021-07-06 DIAGNOSIS — E1165 Type 2 diabetes mellitus with hyperglycemia: Secondary | ICD-10-CM | POA: Diagnosis not present

## 2021-07-06 DIAGNOSIS — E538 Deficiency of other specified B group vitamins: Secondary | ICD-10-CM | POA: Diagnosis not present

## 2021-07-06 DIAGNOSIS — K219 Gastro-esophageal reflux disease without esophagitis: Secondary | ICD-10-CM | POA: Diagnosis not present

## 2021-10-21 DIAGNOSIS — E1165 Type 2 diabetes mellitus with hyperglycemia: Secondary | ICD-10-CM | POA: Diagnosis not present

## 2021-10-21 DIAGNOSIS — Z794 Long term (current) use of insulin: Secondary | ICD-10-CM | POA: Diagnosis not present

## 2021-10-21 DIAGNOSIS — M79671 Pain in right foot: Secondary | ICD-10-CM | POA: Diagnosis not present

## 2021-10-21 DIAGNOSIS — G5761 Lesion of plantar nerve, right lower limb: Secondary | ICD-10-CM | POA: Diagnosis not present

## 2021-11-02 ENCOUNTER — Inpatient Hospital Stay
Admission: EM | Admit: 2021-11-02 | Discharge: 2021-11-08 | DRG: 502 | Disposition: A | Discharge status: Home Health | Payer: PPO | Attending: Internal Medicine | Admitting: Internal Medicine

## 2021-11-02 ENCOUNTER — Emergency Department: Payer: PPO

## 2021-11-02 ENCOUNTER — Inpatient Hospital Stay: Payer: PPO

## 2021-11-02 ENCOUNTER — Encounter: Payer: Self-pay | Admitting: Emergency Medicine

## 2021-11-02 ENCOUNTER — Other Ambulatory Visit: Payer: Self-pay

## 2021-11-02 DIAGNOSIS — Z90710 Acquired absence of both cervix and uterus: Secondary | ICD-10-CM

## 2021-11-02 DIAGNOSIS — I10 Essential (primary) hypertension: Secondary | ICD-10-CM | POA: Diagnosis present

## 2021-11-02 DIAGNOSIS — J849 Interstitial pulmonary disease, unspecified: Secondary | ICD-10-CM | POA: Diagnosis not present

## 2021-11-02 DIAGNOSIS — M791 Myalgia, unspecified site: Secondary | ICD-10-CM | POA: Diagnosis not present

## 2021-11-02 DIAGNOSIS — F419 Anxiety disorder, unspecified: Secondary | ICD-10-CM | POA: Diagnosis present

## 2021-11-02 DIAGNOSIS — J45909 Unspecified asthma, uncomplicated: Secondary | ICD-10-CM

## 2021-11-02 DIAGNOSIS — S86019A Strain of unspecified Achilles tendon, initial encounter: Secondary | ICD-10-CM

## 2021-11-02 DIAGNOSIS — E1151 Type 2 diabetes mellitus with diabetic peripheral angiopathy without gangrene: Secondary | ICD-10-CM | POA: Diagnosis present

## 2021-11-02 DIAGNOSIS — W19XXXD Unspecified fall, subsequent encounter: Secondary | ICD-10-CM | POA: Diagnosis not present

## 2021-11-02 DIAGNOSIS — Z882 Allergy status to sulfonamides status: Secondary | ICD-10-CM

## 2021-11-02 DIAGNOSIS — G4733 Obstructive sleep apnea (adult) (pediatric): Secondary | ICD-10-CM | POA: Diagnosis present

## 2021-11-02 DIAGNOSIS — Z794 Long term (current) use of insulin: Secondary | ICD-10-CM | POA: Diagnosis not present

## 2021-11-02 DIAGNOSIS — G8918 Other acute postprocedural pain: Secondary | ICD-10-CM | POA: Diagnosis not present

## 2021-11-02 DIAGNOSIS — X501XXA Overexertion from prolonged static or awkward postures, initial encounter: Secondary | ICD-10-CM

## 2021-11-02 DIAGNOSIS — Z20822 Contact with and (suspected) exposure to covid-19: Secondary | ICD-10-CM | POA: Diagnosis not present

## 2021-11-02 DIAGNOSIS — Z885 Allergy status to narcotic agent status: Secondary | ICD-10-CM

## 2021-11-02 DIAGNOSIS — Z7951 Long term (current) use of inhaled steroids: Secondary | ICD-10-CM

## 2021-11-02 DIAGNOSIS — S86011A Strain of right Achilles tendon, initial encounter: Secondary | ICD-10-CM | POA: Diagnosis present

## 2021-11-02 DIAGNOSIS — M810 Age-related osteoporosis without current pathological fracture: Secondary | ICD-10-CM | POA: Diagnosis present

## 2021-11-02 DIAGNOSIS — G4730 Sleep apnea, unspecified: Secondary | ICD-10-CM | POA: Diagnosis present

## 2021-11-02 DIAGNOSIS — Z880 Allergy status to penicillin: Secondary | ICD-10-CM

## 2021-11-02 DIAGNOSIS — S92001A Unspecified fracture of right calcaneus, initial encounter for closed fracture: Principal | ICD-10-CM

## 2021-11-02 DIAGNOSIS — Z9841 Cataract extraction status, right eye: Secondary | ICD-10-CM | POA: Diagnosis not present

## 2021-11-02 DIAGNOSIS — E7800 Pure hypercholesterolemia, unspecified: Secondary | ICD-10-CM | POA: Diagnosis not present

## 2021-11-02 DIAGNOSIS — S86091A Other specified injury of right Achilles tendon, initial encounter: Secondary | ICD-10-CM | POA: Diagnosis not present

## 2021-11-02 DIAGNOSIS — F32A Depression, unspecified: Secondary | ICD-10-CM | POA: Diagnosis not present

## 2021-11-02 DIAGNOSIS — W010XXA Fall on same level from slipping, tripping and stumbling without subsequent striking against object, initial encounter: Secondary | ICD-10-CM | POA: Diagnosis present

## 2021-11-02 DIAGNOSIS — S86011D Strain of right Achilles tendon, subsequent encounter: Secondary | ICD-10-CM | POA: Diagnosis not present

## 2021-11-02 DIAGNOSIS — M9261 Juvenile osteochondrosis of tarsus, right ankle: Secondary | ICD-10-CM | POA: Diagnosis not present

## 2021-11-02 DIAGNOSIS — Z7984 Long term (current) use of oral hypoglycemic drugs: Secondary | ICD-10-CM

## 2021-11-02 DIAGNOSIS — K5900 Constipation, unspecified: Secondary | ICD-10-CM | POA: Diagnosis not present

## 2021-11-02 DIAGNOSIS — M67879 Other specified disorders of synovium and tendon, unspecified ankle and foot: Secondary | ICD-10-CM | POA: Diagnosis present

## 2021-11-02 DIAGNOSIS — E119 Type 2 diabetes mellitus without complications: Secondary | ICD-10-CM | POA: Diagnosis not present

## 2021-11-02 DIAGNOSIS — M79604 Pain in right leg: Secondary | ICD-10-CM | POA: Diagnosis not present

## 2021-11-02 DIAGNOSIS — F418 Other specified anxiety disorders: Secondary | ICD-10-CM | POA: Diagnosis not present

## 2021-11-02 DIAGNOSIS — M6788 Other specified disorders of synovium and tendon, other site: Secondary | ICD-10-CM

## 2021-11-02 DIAGNOSIS — R52 Pain, unspecified: Secondary | ICD-10-CM

## 2021-11-02 DIAGNOSIS — E876 Hypokalemia: Secondary | ICD-10-CM | POA: Diagnosis present

## 2021-11-02 DIAGNOSIS — Z01818 Encounter for other preprocedural examination: Secondary | ICD-10-CM | POA: Diagnosis not present

## 2021-11-02 DIAGNOSIS — E1165 Type 2 diabetes mellitus with hyperglycemia: Secondary | ICD-10-CM | POA: Diagnosis present

## 2021-11-02 DIAGNOSIS — M21861 Other specified acquired deformities of right lower leg: Secondary | ICD-10-CM | POA: Diagnosis not present

## 2021-11-02 DIAGNOSIS — E669 Obesity, unspecified: Secondary | ICD-10-CM | POA: Diagnosis present

## 2021-11-02 DIAGNOSIS — K219 Gastro-esophageal reflux disease without esophagitis: Secondary | ICD-10-CM | POA: Diagnosis not present

## 2021-11-02 DIAGNOSIS — Z79899 Other long term (current) drug therapy: Secondary | ICD-10-CM

## 2021-11-02 DIAGNOSIS — IMO0001 Reserved for inherently not codable concepts without codable children: Secondary | ICD-10-CM

## 2021-11-02 DIAGNOSIS — I739 Peripheral vascular disease, unspecified: Secondary | ICD-10-CM | POA: Diagnosis not present

## 2021-11-02 DIAGNOSIS — Z531 Procedure and treatment not carried out because of patient's decision for reasons of belief and group pressure: Secondary | ICD-10-CM | POA: Diagnosis not present

## 2021-11-02 DIAGNOSIS — E538 Deficiency of other specified B group vitamins: Secondary | ICD-10-CM | POA: Diagnosis not present

## 2021-11-02 DIAGNOSIS — J449 Chronic obstructive pulmonary disease, unspecified: Secondary | ICD-10-CM | POA: Diagnosis present

## 2021-11-02 DIAGNOSIS — Z961 Presence of intraocular lens: Secondary | ICD-10-CM | POA: Diagnosis present

## 2021-11-02 DIAGNOSIS — R531 Weakness: Secondary | ICD-10-CM | POA: Diagnosis not present

## 2021-11-02 DIAGNOSIS — R5381 Other malaise: Secondary | ICD-10-CM | POA: Diagnosis not present

## 2021-11-02 DIAGNOSIS — M62461 Contracture of muscle, right lower leg: Secondary | ICD-10-CM

## 2021-11-02 DIAGNOSIS — S92034D Nondisplaced avulsion fracture of tuberosity of right calcaneus, subsequent encounter for fracture with routine healing: Secondary | ICD-10-CM | POA: Diagnosis not present

## 2021-11-02 DIAGNOSIS — Z6835 Body mass index (BMI) 35.0-35.9, adult: Secondary | ICD-10-CM

## 2021-11-02 DIAGNOSIS — Z7401 Bed confinement status: Secondary | ICD-10-CM | POA: Diagnosis not present

## 2021-11-02 DIAGNOSIS — M1990 Unspecified osteoarthritis, unspecified site: Secondary | ICD-10-CM | POA: Diagnosis not present

## 2021-11-02 DIAGNOSIS — Z01810 Encounter for preprocedural cardiovascular examination: Secondary | ICD-10-CM | POA: Diagnosis not present

## 2021-11-02 DIAGNOSIS — I70201 Unspecified atherosclerosis of native arteries of extremities, right leg: Secondary | ICD-10-CM | POA: Diagnosis not present

## 2021-11-02 DIAGNOSIS — S92031A Displaced avulsion fracture of tuberosity of right calcaneus, initial encounter for closed fracture: Secondary | ICD-10-CM | POA: Diagnosis not present

## 2021-11-02 LAB — COMPREHENSIVE METABOLIC PANEL
ALT: 18 U/L (ref 0–44)
AST: 27 U/L (ref 15–41)
Albumin: 4.1 g/dL (ref 3.5–5.0)
Alkaline Phosphatase: 95 U/L (ref 38–126)
Anion gap: 7 (ref 5–15)
BUN: 12 mg/dL (ref 8–23)
CO2: 28 mmol/L (ref 22–32)
Calcium: 8.9 mg/dL (ref 8.9–10.3)
Chloride: 98 mmol/L (ref 98–111)
Creatinine, Ser: 0.79 mg/dL (ref 0.44–1.00)
GFR, Estimated: >60 mL/min (ref >60)
Glucose, Bld: 154 mg/dL — ABNORMAL HIGH (ref 70–99)
Potassium: 3.3 mmol/L — ABNORMAL LOW (ref 3.5–5.1)
Sodium: 133 mmol/L — ABNORMAL LOW (ref 135–145)
Total Bilirubin: 0.9 mg/dL (ref 0.3–1.2)
Total Protein: 7.8 g/dL (ref 6.5–8.1)

## 2021-11-02 LAB — CBC
HCT: 43.5 % (ref 36.0–46.0)
Hemoglobin: 13.7 g/dL (ref 12.0–15.0)
MCH: 27 pg (ref 26.0–34.0)
MCHC: 31.5 g/dL (ref 30.0–36.0)
MCV: 85.8 fL (ref 80.0–100.0)
Platelets: 189 10*3/uL (ref 150–400)
RBC: 5.07 MIL/uL (ref 3.87–5.11)
RDW: 14.6 % (ref 11.5–15.5)
WBC: 8.2 10*3/uL (ref 4.0–10.5)
nRBC: 0 % (ref 0.0–0.2)

## 2021-11-02 LAB — GLUCOSE, CAPILLARY: Glucose-Capillary: 146 mg/dL — ABNORMAL HIGH (ref 70–99)

## 2021-11-02 MED ORDER — SENNOSIDES-DOCUSATE SODIUM 8.6-50 MG PO TABS: 1.0000 | ORAL_TABLET | Freq: Every evening | ORAL | Status: DC | PRN

## 2021-11-02 MED ORDER — FENTANYL CITRATE PF 50 MCG/ML IJ SOSY: 25.0000 ug | PREFILLED_SYRINGE | INTRAMUSCULAR | Status: DC | PRN

## 2021-11-02 MED ORDER — ACETAMINOPHEN 325 MG PO TABS
650.0000 mg | ORAL_TABLET | Freq: Once | ORAL | Status: AC
  Administered 2021-11-02: 650 mg via ORAL
  Filled 2021-11-02: qty 2

## 2021-11-02 MED ORDER — AMLODIPINE BESYLATE 5 MG PO TABS
5.0000 mg | ORAL_TABLET | Freq: Every day | ORAL | Status: DC
  Administered 2021-11-05 – 2021-11-08 (×4): 5 mg via ORAL
  Filled 2021-11-02 (×6): qty 1

## 2021-11-02 MED ORDER — ALBUTEROL SULFATE HFA 108 (90 BASE) MCG/ACT IN AERS
2.0000 | INHALATION_SPRAY | Freq: Four times a day (QID) | RESPIRATORY_TRACT | Status: DC | PRN
  Filled 2021-11-02: qty 6.7

## 2021-11-02 MED ORDER — INSULIN ASPART 100 UNIT/ML IJ SOLN
0.0000 [IU] | INTRAMUSCULAR | Status: DC
  Administered 2021-11-03: 3 [IU] via SUBCUTANEOUS
  Administered 2021-11-03: 2 [IU] via SUBCUTANEOUS
  Administered 2021-11-04: 1 [IU] via SUBCUTANEOUS
  Administered 2021-11-04: 3 [IU] via SUBCUTANEOUS
  Administered 2021-11-04 – 2021-11-05 (×4): 1 [IU] via SUBCUTANEOUS
  Administered 2021-11-05 (×2): 2 [IU] via SUBCUTANEOUS
  Administered 2021-11-06 (×2): 1 [IU] via SUBCUTANEOUS
  Administered 2021-11-06: 2 [IU] via SUBCUTANEOUS
  Administered 2021-11-07 (×3): 1 [IU] via SUBCUTANEOUS
  Administered 2021-11-07: 2 [IU] via SUBCUTANEOUS
  Administered 2021-11-08 (×3): 1 [IU] via SUBCUTANEOUS
  Filled 2021-11-02 (×21): qty 1

## 2021-11-02 MED ORDER — POTASSIUM CHLORIDE CRYS ER 20 MEQ PO TBCR
30.0000 meq | EXTENDED_RELEASE_TABLET | Freq: Once | ORAL | Status: AC
  Administered 2021-11-02: 30 meq via ORAL
  Filled 2021-11-02: qty 1

## 2021-11-02 MED ORDER — ONDANSETRON HCL 4 MG PO TABS: 4.0000 mg | ORAL_TABLET | Freq: Four times a day (QID) | ORAL | Status: DC | PRN

## 2021-11-02 MED ORDER — ACETAMINOPHEN 325 MG PO TABS
650.0000 mg | ORAL_TABLET | Freq: Four times a day (QID) | ORAL | Status: DC | PRN
  Administered 2021-11-03 – 2021-11-05 (×7): 650 mg via ORAL
  Filled 2021-11-02 (×7): qty 2

## 2021-11-02 MED ORDER — OXYCODONE HCL 5 MG PO TABS
5.0000 mg | ORAL_TABLET | ORAL | Status: DC | PRN
  Administered 2021-11-02 – 2021-11-03 (×3): 5 mg via ORAL
  Filled 2021-11-02 (×3): qty 1

## 2021-11-02 MED ORDER — PANTOPRAZOLE SODIUM 40 MG PO TBEC
40.0000 mg | DELAYED_RELEASE_TABLET | Freq: Every day | ORAL | Status: DC
Start: 2021-11-03 — End: 2021-11-08
  Administered 2021-11-04 – 2021-11-08 (×5): 40 mg via ORAL
  Filled 2021-11-02 (×6): qty 1

## 2021-11-02 MED ORDER — OXYCODONE HCL 5 MG PO TABS
5.0000 mg | ORAL_TABLET | Freq: Once | ORAL | Status: AC
  Administered 2021-11-02: 5 mg via ORAL
  Filled 2021-11-02: qty 1

## 2021-11-02 MED ORDER — ONDANSETRON HCL 4 MG/2ML IJ SOLN
4.0000 mg | Freq: Four times a day (QID) | INTRAMUSCULAR | Status: DC | PRN
  Administered 2021-11-03: 4 mg via INTRAVENOUS

## 2021-11-02 MED ORDER — SODIUM CHLORIDE 0.9 % IV SOLN
INTRAVENOUS | Status: AC
Start: 2021-11-02 — End: 2021-11-03

## 2021-11-02 MED ORDER — ACETAMINOPHEN 650 MG RE SUPP: 650.0000 mg | Freq: Four times a day (QID) | RECTAL | Status: DC | PRN

## 2021-11-02 MED ORDER — INSULIN DETEMIR 100 UNIT/ML ~~LOC~~ SOLN
10.0000 [IU] | Freq: Every day | SUBCUTANEOUS | Status: DC
  Administered 2021-11-02 – 2021-11-07 (×6): 10 [IU] via SUBCUTANEOUS
  Filled 2021-11-02 (×7): qty 0.1

## 2021-11-02 MED ORDER — MONTELUKAST SODIUM 10 MG PO TABS
10.0000 mg | ORAL_TABLET | Freq: Every day | ORAL | Status: DC
  Administered 2021-11-02 – 2021-11-07 (×6): 10 mg via ORAL
  Filled 2021-11-02 (×6): qty 1

## 2021-11-02 MED ORDER — MOMETASONE FURO-FORMOTEROL FUM 200-5 MCG/ACT IN AERO
2.0000 | INHALATION_SPRAY | Freq: Two times a day (BID) | RESPIRATORY_TRACT | Status: DC
  Administered 2021-11-02 – 2021-11-08 (×11): 2 via RESPIRATORY_TRACT
  Filled 2021-11-02: qty 8.8

## 2021-11-02 MED ORDER — SERTRALINE HCL 50 MG PO TABS
50.0000 mg | ORAL_TABLET | Freq: Every day | ORAL | Status: DC
  Administered 2021-11-02 – 2021-11-04 (×3): 50 mg via ORAL
  Filled 2021-11-02 (×3): qty 1

## 2021-11-02 NOTE — ED Notes (Signed)
Purewick placed on pt. 

## 2021-11-02 NOTE — Consult Note (Signed)
? ?PODIATRY CONSULTATION ? ?NAME Isabella White ?MRN [id number] ?DOB [birth year] ?DOA 11/02/2021  ? ?Reason for consult: avulsion fracture calcaneus RT ?Chief Complaint  ?Patient presents with  ? Fall  ? Ankle Pain  ? ? ?Consulting physician: Odie Sera MD ? ?History of present illness: 76 y.o. female DM type II w/o complication presented to the ED after mechanical fall without LOC which occurred in the hallway within the [facility] hospital while visiting a relative in ICU.  X-rays taken positive for avulsion fracture of the calcaneus.  Podiatry consulted ? ?Past Medical History:  ?Diagnosis Date  ? Anemia   ? Arthritis   ? Asthma   ? COPD (chronic obstructive pulmonary disease) (HCC)   ? Cough   ? CHRONIC  ? Depression   ? Diabetes mellitus without complication (HCC)   ? HAS GLUCOSE SENSOR LEFT SHOULDER  ? Dyspnea   ? GERD (gastroesophageal reflux disease)   ? Heart murmur   ? Hypertension   ? Sleep apnea   ? CPAP  ? Wheezing   ? ? ?CBC Latest Ref Rng & Units 03/26/2017 09/03/2014  ?WBC 3.6 - 11.0 K/uL 12.2(H) 8.9  ?Hemoglobin 12.0 - 16.0 g/dL 83.4 19.6  ?Hematocrit 35.0 - 47.0 % 40.6 37.8  ?Platelets 150 - 440 K/uL 226 228  ? ? ?BMP Latest Ref Rng & Units 03/26/2017 09/03/2014  ?Glucose 65 - 99 mg/dL 222(L) 798(X)  ?BUN 6 - 20 mg/dL 21(J) 94(R)  ?Creatinine 0.44 - 1.00 mg/dL 7.40 8.14  ?Sodium 135 - 145 mmol/L 136 140  ?Potassium 3.5 - 5.1 mmol/L 4.0 4.3  ?Chloride 101 - 111 mmol/L 103 104  ?CO2 22 - 32 mmol/L 24 27  ?Calcium 8.9 - 10.3 mg/dL 9.3 8.9  ? ? ?  ?Physical Exam: ?General: The patient is alert and oriented x3 in no acute distress.  ? ?Dermatology: No open lacerations noted ? ?Vascular: Palpable pedal pulses bilaterally.  Edema with ecchymosis noted specifically to the posterior heel ? ?Neurological: Epicritic and protective threshold grossly intact bilaterally.  ? ?Musculoskeletal Exam: No structural deformity noted.  Pain is tolerable.  Patient is actually able to move her ankle up and down and flex the  foot. ? ?Radiographic exam RT ankle today, 11/02/2021, 1640: ?FINDINGS: ?There is an avulsion fracture off the posterior calcaneus at the ?insertion of the Achilles tendon. Degenerative changes in the right ?ankle with spurring. No tibia or fibular fracture. Plantar calcaneal ?spur. ?  ?IMPRESSION: ?Avulsion fracture of the posterior calcaneus at the Achilles tendon ?insertion. Large displaced bone fragment. ? ? ?ASSESSMENT/PLAN OF CARE ?Acute displaced avulsion fracture posterior calcaneus at the Achilles tendon insertion RT ?-Patient was seen at bedside in the ED.  Patient lives alone.  After discussing with the patient I do believe it is appropriate for admission to scheduled for surgery tomorrow.  Surgery was explained to the patient in detail.  No guarantees were expressed or implied.  All patient questions answered. ?-Patient declined outpatient rehab postdischarge.  She explained that she has family that she will contact to stay with her while she recovers postoperatively ?-Strict nonweightbearing RLE.  Patient is going to look into purchasing a knee scooter ?-Preoperative orders placed. Sx to consist of open repair Achilles right.  Possible gastroc lengthening right.  Possible tendon transfer right. ?-Order placed MRI RT heel ?-NPO after midnight. Sx tentatively scheduled for tomorrow, 3/15, after 1PM ?-Podiatry will follow ?  ?Thank you for the consult.   ?  ?Felecia Shelling, DPM ?[facility] ? ?Dr. Edrick Kins, DPM  ?  ?[address]                                        ?[place], [place] [address]                ?Office [phone]  ?Fax [phone] ? ? ? ? ?

## 2021-11-02 NOTE — ED Notes (Signed)
X-ray at bedside

## 2021-11-02 NOTE — ED Provider Notes (Signed)
? ?[facility] ?Provider Note ? ? ? Event Date/Time  ? First MD Initiated Contact with Patient 11/02/21 1631   ?  (approximate) ? ? ?History  ? ?Fall and Ankle Pain ? ? ?HPI ? ?BIANCE MONCRIEF is a 76 y.o. female with a history of hypertension, diabetes, COPD, and as listed in EMR presents to the emergency department for treatment and evaluation of right foot pain after mechanical, nonsyncopal fall while upstairs visiting a family member.  She states that she tripped and fell onto her right knee and twisted her right foot. She has had previous surgeries on the same foot/ankle in the 80s. She denies striking her head or loss of consciousness. Pain increases with attempt to bear any weight. ? ?  ? ? ?Physical Exam  ? ?Triage Vital Signs: ?ED Triage Vitals  ?Enc Vitals Group  ?   BP 154/71  ?   Pulse 81  ?   Resp 18  ?   Temp 98.1  ?   Temp src   ?   SpO2   ?   Weight   ?   Height   ?   Head Circumference   ?   Peak Flow   ?   Pain Score   ?   Pain Loc   ?   Pain Edu?   ?   Excl. in GC?   ? ? ?Most recent vital signs: ?Vitals:  ? 11/02/21 1631 11/02/21 2014  ?BP: (!) 154/71 (!) 153/69  ?Pulse: 81 71  ?Resp: 18 (!) 24  ?Temp: 98.1 ?F (36.7 ?C) 98.3 ?F (36.8 ?C)  ?SpO2: 100% 98%  ? ? ?General: Awake, no distress.  ?CV:  Good peripheral perfusion.  ?Resp:  Normal effort.  ?Abd:  No distention.  ?Other:  Right foot tender over medial malleolus and calcaneus.  Early ecchymosis noted over the posterior calcaneal area. No tenderness in the knees or hips with passive ROM. No tenderness in the upper extremities with passive ROM. No focal tenderness over the length of the spine. ? ? ?ED Results / Procedures / Treatments  ? ?Labs ?(all labs ordered are listed, but only abnormal results are displayed) ?Labs Reviewed  ?COMPREHENSIVE METABOLIC PANEL - Abnormal; Notable for the following components:  ?    Result Value  ? Sodium 133 (*)   ? Potassium 3.3 (*)   ? Glucose, Bld 154 (*)   ? All other components within  normal limits  ?GLUCOSE, CAPILLARY - Abnormal; Notable for the following components:  ? Glucose-Capillary 146 (*)   ? All other components within normal limits  ?CBC  ?BASIC METABOLIC PANEL  ?MAGNESIUM  ? ? ? ?EKG ? ?Not indicated ? ? ?RADIOLOGY ? ?Image and radiology report reviewed by me. ? ?Avulsion fracture of the posterior calcaneus at the insertion point of the Achilles ? ?PROCEDURES: ? ?Critical Care performed: No ? ?Procedures ? ? ?MEDICATIONS ORDERED IN ED: ? ? ? ?IMPRESSION / MDM / ASSESSMENT AND PLAN / ED COURSE  ? ?I have reviewed the triage note. ? ?Differential diagnosis includes, but is not limited to, ankle sprain, tibia/fibula fracture, calcaneal fracture ? ?76 year old female presents to the emergency department for treatment and evaluation after mechanical, nonsyncopal fall while upstairs visiting a family member.  See HPI for further details. ? ?Exam is reassuring with the exception of right foot swelling and tenderness.  See exam for description. ? ?X-ray shows a large avulsion fracture of the posterior calcaneus at the insertion  point of the Achilles tendon.  Results discussed with the patient and family.  Plan is to consult podiatry.  Tylenol given earlier for pain.  Patient declines any additional medications at this time. ? ?Dr. Logan Bores with podiatry was consulted.  He plans to come and see the patient. ? ?Patient will be admitted with the intent of surgical intervention tomorrow.  She is aware that she should be n.p.o. after midnight tonight.  Hospitalist will be consulted for admission. ? ?Patient accepted for admission by Dr. Antionette Char. ? ?  ? ? ?FINAL CLINICAL IMPRESSION(S) / ED DIAGNOSES  ? ?Final diagnoses:  ?Avulsion fracture of right calcaneus  ?Encounter for preoperative assessment  ? ? ? ?Rx / DC Orders  ? ?ED Discharge Orders   ? ? None  ? ?  ? ? ? ?Note:  This document was prepared using Dragon voice recognition software and may include unintentional dictation errors. ?  ?Chinita Pester, FNP ?11/02/21 2149 ? ?  ?Georga Hacking, MD ?11/03/21 0010 ? ?

## 2021-11-02 NOTE — ED Triage Notes (Signed)
Pt tripped and fell on right knee and twisted right ankle. Pt complains of right ankle pain. Denies any other pain at this time. Denies LOC. Pt did not hit head.  ?

## 2021-11-02 NOTE — H&P (Signed)
?History and Physical  ? ? ?Isabella CoveMary A Turpen ZOX:[id number] DOB: [birth year] DOA: 11/02/2021 ? ?PCP: Lynnea FerrierKlein, Bert J III, MD  ? ?Patient coming from: Home  ? ?Chief Complaint: Right ankle pain, fall ? ?HPI: Isabella White is a pleasant 76 y.o. female with medical history significant for hypertension, insulin-dependent diabetes mellitus, asthma, OSA, depression, and anxiety, now presenting to the emergency department with right ankle pain.  Patient has been seen by podiatry recently for right foot pain and has been using a cane due to this, but had otherwise been in her usual state of health when she had her right ankle "give out" today, leading to a fall and acute right ankle pain.  She denies hitting her head or suffering any other injury.  She denies any recent illness, is fully independent, and never experiences angina. ? ?ED Course: Upon arrival to the ED, patient is found to be afebrile and saturating well on room air with stable blood pressure.  Plain radiographs of the right ankle demonstrate avulsion fracture of the posterior calcaneus at the Achilles tendon insertion with large displaced bone fragment.  Podiatry was consulted by the ED physician and medical admission was recommended. ? ?Review of Systems:  ?All other systems reviewed and apart from HPI, are negative. ? ?Past Medical History:  ?Diagnosis Date  ? Anemia   ? Arthritis   ? Asthma   ? COPD (chronic obstructive pulmonary disease) (HCC)   ? Cough   ? CHRONIC  ? Depression   ? Diabetes mellitus without complication (HCC)   ? HAS GLUCOSE SENSOR LEFT SHOULDER  ? Dyspnea   ? GERD (gastroesophageal reflux disease)   ? Heart murmur   ? Hypertension   ? Sleep apnea   ? CPAP  ? Wheezing   ? ? ?Past Surgical History:  ?Procedure Laterality Date  ? ABDOMINAL HYSTERECTOMY    ? BREAST BIOPSY Right 1984  ? neg  ? CATARACT EXTRACTION W/PHACO Left 09/15/2016  ? Procedure: CATARACT EXTRACTION PHACO AND INTRAOCULAR LENS PLACEMENT (IOC);  Surgeon: Nevada CraneBradley Mark King, MD;   Location: [facility];  Service: Ophthalmology;  Laterality: Left;  Lot# [id number] H ?US: 00:48.9 ?AP%: 15.1 ?CDE: 7.39  ? CATARACT EXTRACTION W/PHACO Right 11/10/2016  ? Procedure: CATARACT EXTRACTION PHACO AND INTRAOCULAR LENS PLACEMENT (IOC);  Surgeon: Nevada CraneBradley Mark King, MD;  Location: [facility];  Service: Ophthalmology;  Laterality: Right;  US 56.6 ?AP% 10.8 ?CDE 6.39 ?Fluid Pack lot # [id number]  ? FRACTURE SURGERY    ? ORIF ANKLE  ? INCISION AND DRAINAGE Left 03/27/2017  ? Procedure: INCISION AND DRAINAGE;  Surgeon: Lyndle HerrlichBowers, James R, MD;  Location: [facility];  Service: Orthopedics;  Laterality: Left;  ? ORIF WRIST FRACTURE Left 03/27/2017  ? Procedure: OPEN REDUCTION INTERNAL FIXATION (ORIF) WRIST FRACTURE ULNAR PINNING;  Surgeon: Lyndle HerrlichBowers, James R, MD;  Location: [facility];  Service: Orthopedics;  Laterality: Left;  ? ? ?Social History:  ? reports that she has never smoked. She has never used smokeless tobacco. She reports that she does not drink alcohol. No history on file for drug use. ? ?Allergies  ?Allergen Reactions  ? Sulfa Antibiotics   ? Valium [Diazepam]   ? Penicillins Rash  ? ? ?Family History  ?Problem Relation Age of Onset  ? Breast cancer Neg Hx   ? ? ? ?Prior to Admission medications   ?Medication Sig Start Date End Date Taking? Authorizing Provider  ?albuterol (PROVENTIL HFA;VENTOLIN HFA) 108 (90 Base) MCG/ACT inhaler Inhale into the lungs every 6 (  six) hours as needed for wheezing or shortness of breath.    [provider]  ?amLODipine (NORVASC) 5 MG tablet Take 5 mg by mouth daily.    [provider]  ?budesonide-formoterol (SYMBICORT) 160-4.5 MCG/ACT inhaler Inhale 2 puffs into the lungs 2 (two) times daily.    [provider]  ?COVID-19 mRNA bivalent vaccine, Pfizer, (PFIZER COVID-19 VAC BIVALENT) injection Inject into the muscle. 05/18/21   Judyann Munson, MD  ?COVID-19 mRNA Vac-TriS, Pfizer, (PFIZER-BIONT COVID-19 VAC-TRIS) SUSP injection Inject into the muscle. 02/02/21   Judyann Munson, MD  ?cyanocobalamin (,VITAMIN B-12,) 1000 MCG/ML injection Inject 1,000 mcg into the muscle every 30 (thirty) days.    [provider]  ?ferrous sulfate 325 (65 FE) MG tablet Take 325 mg by mouth daily with breakfast.    [provider]  ?hydrochlorothiazide (HYDRODIURIL) 25 MG tablet Take 25 mg by mouth daily.    [provider]  ?HYDROcodone-acetaminophen (NORCO) 5-325 MG tablet Take 1-2 tablets by mouth every 4 (four) hours as needed for moderate pain. ?Patient not taking: Reported on 11/02/2021 03/26/17   Schaevitz, Myra Rude, MD  ?losartan (COZAAR) 100 MG tablet Take 100 mg by mouth daily.    [provider]  ?metFORMIN (GLUCOPHAGE-XR) 500 MG 24 hr tablet Take 2,000 mg by mouth at bedtime.    [provider]  ?metoprolol succinate (TOPROL-XL) 50 MG 24 hr tablet Take 25-50 mg by mouth 2 (two) times daily. 10/07/21   [provider]  ?montelukast (SINGULAIR) 10 MG tablet Take 10 mg by mouth at bedtime.    [provider]  ?Multiple Vitamin (MULTIVITAMIN) tablet Take 1 tablet by mouth daily.    [provider]  ?omeprazole (PRILOSEC) 20 MG capsule Take 20 mg by mouth 2 (two) times daily as needed. 10/07/21   [provider]  ?omeprazole (PRILOSEC) 40 MG capsule Take 40 mg by mouth daily.    [provider]  ?sertraline (ZOLOFT) 100 MG tablet Take 100 mg by mouth daily. 09/22/21   [provider]  ?vitamin B-12 (CYANOCOBALAMIN) 1000 MCG tablet Take 1,000 mcg by mouth daily.    [provider]  ? ? ?Physical Exam: ?Vitals:  ? 11/02/21 1631 11/02/21 1633  ?BP: (!) 154/71   ?Pulse: 81   ?Resp: 18   ?Temp: 98.1 ?F (36.7 ?C)   ?TempSrc: Oral   ?SpO2: 100%   ?Weight:  85.3 kg  ?Height:  5\' 1"  (1.549 m)  ? ? ?Constitutional: NAD, calm  ?Eyes: PERTLA, lids and conjunctivae normal ?ENMT: Mucous membranes are moist. Posterior pharynx clear of any exudate or lesions.   ?Neck: supple, no masses  ?Respiratory: no  wheezing, no crackles. No accessory muscle use.  ?Cardiovascular: S1 & S2 heard, regular rate and rhythm. No JVD.   ?Abdomen: No distension, no tenderness, soft. Bowel sounds active.  ?Musculoskeletal: no clubbing / cyanosis. Right heel tenderness.   ?Skin: no significant rashes, lesions, ulcers. Warm, dry, well-perfused. ?Neurologic: CN 2-12 grossly intact. Moving all extremities. Alert and oriented.  ?Psychiatric: Very pleasant. Cooperative.  ? ? ?Labs and Imaging on Admission: I have personally reviewed following labs and imaging studies ? ?CBC: ?Recent Labs  ?Lab 11/02/21 ?1935  ?WBC 8.2  ?HGB 13.7  ?HCT 43.5  ?MCV 85.8  ?PLT 189  ? ?Basic Metabolic Panel: ?No results for input(s): NA, K, CL, CO2, GLUCOSE, BUN, CREATININE, CALCIUM, MG, PHOS in the last 168 hours. ?GFR: ?CrCl cannot be calculated (Patient's most recent lab result is  older than the maximum 21 days allowed.). ?Liver Function Tests: ?No results for input(s): AST, ALT, ALKPHOS, BILITOT, PROT, ALBUMIN in the last 168 hours. ?No results for input(s): LIPASE, AMYLASE in the last 168 hours. ?No results for input(s): AMMONIA in the last 168 hours. ?Coagulation Profile: ?No results for input(s): INR, PROTIME in the last 168 hours. ?Cardiac Enzymes: ?No results for input(s): CKTOTAL, CKMB, CKMBINDEX, TROPONINI in the last 168 hours. ?BNP (last 3 results) ?No results for input(s): PROBNP in the last 8760 hours. ?HbA1C: ?No results for input(s): HGBA1C in the last 72 hours. ?CBG: ?No results for input(s): GLUCAP in the last 168 hours. ?Lipid Profile: ?No results for input(s): CHOL, HDL, LDLCALC, TRIG, CHOLHDL, LDLDIRECT in the last 72 hours. ?Thyroid Function Tests: ?No results for input(s): TSH, T4TOTAL, FREET4, T3FREE, THYROIDAB in the last 72 hours. ?Anemia Panel: ?No results for input(s): VITAMINB12, FOLATE, FERRITIN, TIBC, IRON, RETICCTPCT in the last 72 hours. ?Urine analysis: ?No results found for: COLORURINE, APPEARANCEUR, LABSPEC, PHURINE, GLUCOSEU,  HGBUR, BILIRUBINUR, KETONESUR, PROTEINUR, UROBILINOGEN, NITRITE, LEUKOCYTESUR ?Sepsis Labs: ?@[email or web address] (procalcitonin:4,lacticidven:4) ?)No results found for this or any previous visit (from the past 24

## 2021-11-03 ENCOUNTER — Inpatient Hospital Stay: Payer: PPO | Admitting: Anesthesiology

## 2021-11-03 ENCOUNTER — Inpatient Hospital Stay: Payer: PPO

## 2021-11-03 ENCOUNTER — Other Ambulatory Visit: Payer: Self-pay

## 2021-11-03 ENCOUNTER — Encounter: Payer: Self-pay | Admitting: Family Medicine

## 2021-11-03 ENCOUNTER — Encounter
Admission: EM | Disposition: A | Discharge status: Home Health | Payer: Self-pay | Source: Home / Self Care | Attending: Internal Medicine

## 2021-11-03 DIAGNOSIS — S92031A Displaced avulsion fracture of tuberosity of right calcaneus, initial encounter for closed fracture: Secondary | ICD-10-CM | POA: Diagnosis not present

## 2021-11-03 LAB — GLUCOSE, CAPILLARY
Glucose-Capillary: 114 mg/dL — ABNORMAL HIGH (ref 70–99)
Glucose-Capillary: 125 mg/dL — ABNORMAL HIGH (ref 70–99)
Glucose-Capillary: 161 mg/dL — ABNORMAL HIGH (ref 70–99)
Glucose-Capillary: 204 mg/dL — ABNORMAL HIGH (ref 70–99)
Glucose-Capillary: 213 mg/dL — ABNORMAL HIGH (ref 70–99)
Glucose-Capillary: 264 mg/dL — ABNORMAL HIGH (ref 70–99)
Glucose-Capillary: 300 mg/dL — ABNORMAL HIGH (ref 70–99)

## 2021-11-03 LAB — BASIC METABOLIC PANEL
Anion gap: 6 (ref 5–15)
BUN: 12 mg/dL (ref 8–23)
CO2: 28 mmol/L (ref 22–32)
Calcium: 9.1 mg/dL (ref 8.9–10.3)
Chloride: 104 mmol/L (ref 98–111)
Creatinine, Ser: 0.82 mg/dL (ref 0.44–1.00)
GFR, Estimated: >60 mL/min (ref >60)
Glucose, Bld: 126 mg/dL — ABNORMAL HIGH (ref 70–99)
Potassium: 4.2 mmol/L (ref 3.5–5.1)
Sodium: 138 mmol/L (ref 135–145)

## 2021-11-03 LAB — MAGNESIUM: Magnesium: 2.1 mg/dL (ref 1.7–2.4)

## 2021-11-03 LAB — SURGICAL PCR SCREEN
MRSA, PCR: NEGATIVE
Staphylococcus aureus: POSITIVE — AB

## 2021-11-03 SURGERY — REPAIR, TENDON, ACHILLES
Anesthesia: General | Laterality: Right

## 2021-11-03 MED ORDER — MIDAZOLAM HCL 2 MG/2ML IJ SOLN
INTRAMUSCULAR | Status: AC
Start: 2021-11-03 — End: 2021-11-04
  Filled 2021-11-03: qty 2

## 2021-11-03 MED ORDER — PROPOFOL 10 MG/ML IV BOLUS
INTRAVENOUS | Status: AC
  Filled 2021-11-03: qty 20

## 2021-11-03 MED ORDER — LIDOCAINE HCL (PF) 1 % IJ SOLN
INTRAMUSCULAR | Status: DC | PRN
  Administered 2021-11-03: 3 mL via SUBCUTANEOUS

## 2021-11-03 MED ORDER — BUPIVACAINE HCL (PF) 0.5 % IJ SOLN
INTRAMUSCULAR | Status: AC
  Filled 2021-11-03: qty 20

## 2021-11-03 MED ORDER — FENTANYL CITRATE (PF) 100 MCG/2ML IJ SOLN
INTRAMUSCULAR | Status: DC | PRN
Start: 2021-11-03 — End: 2021-11-03
  Administered 2021-11-03 (×2): 50 ug via INTRAVENOUS

## 2021-11-03 MED ORDER — DEXAMETHASONE SODIUM PHOSPHATE 10 MG/ML IJ SOLN
INTRAMUSCULAR | Status: AC
  Filled 2021-11-03: qty 1

## 2021-11-03 MED ORDER — 0.9 % SODIUM CHLORIDE (POUR BTL) OPTIME
TOPICAL | Status: DC | PRN
  Administered 2021-11-03: 500 mL

## 2021-11-03 MED ORDER — LIDOCAINE HCL (CARDIAC) PF 100 MG/5ML IV SOSY
PREFILLED_SYRINGE | INTRAVENOUS | Status: DC | PRN
  Administered 2021-11-03: 100 mg via INTRAVENOUS

## 2021-11-03 MED ORDER — FENTANYL CITRATE (PF) 100 MCG/2ML IJ SOLN
INTRAMUSCULAR | Status: AC
Start: 2021-11-03 — End: ?
  Filled 2021-11-03: qty 2

## 2021-11-03 MED ORDER — PHENYLEPHRINE HCL-NACL 20-0.9 MG/250ML-% IV SOLN
INTRAVENOUS | Status: DC | PRN
  Administered 2021-11-03: 50 ug/min via INTRAVENOUS

## 2021-11-03 MED ORDER — VANCOMYCIN HCL IN DEXTROSE 1-5 GM/200ML-% IV SOLN
1000.0000 mg | INTRAVENOUS | Status: AC
  Administered 2021-11-03: 1000 mg via INTRAVENOUS
  Filled 2021-11-03: qty 200

## 2021-11-03 MED ORDER — BUPIVACAINE HCL (PF) 0.5 % IJ SOLN
INTRAMUSCULAR | Status: AC
  Filled 2021-11-03: qty 30

## 2021-11-03 MED ORDER — DEXAMETHASONE SODIUM PHOSPHATE 10 MG/ML IJ SOLN
INTRAMUSCULAR | Status: DC | PRN
  Administered 2021-11-03: 10 mg via INTRAVENOUS

## 2021-11-03 MED ORDER — FENTANYL CITRATE (PF) 100 MCG/2ML IJ SOLN
INTRAMUSCULAR | Status: AC
  Filled 2021-11-03: qty 2

## 2021-11-03 MED ORDER — BUPIVACAINE-EPINEPHRINE (PF) 0.5% -1:200000 IJ SOLN
INTRAMUSCULAR | Status: AC
  Filled 2021-11-03: qty 30

## 2021-11-03 MED ORDER — FENTANYL CITRATE (PF) 100 MCG/2ML IJ SOLN
25.0000 ug | INTRAMUSCULAR | Status: DC | PRN
  Administered 2021-11-03 (×4): 25 ug via INTRAVENOUS

## 2021-11-03 MED ORDER — SUCCINYLCHOLINE CHLORIDE 200 MG/10ML IV SOSY
PREFILLED_SYRINGE | INTRAVENOUS | Status: DC | PRN
  Administered 2021-11-03: 100 mg via INTRAVENOUS

## 2021-11-03 MED ORDER — BUPIVACAINE HCL (PF) 0.25 % IJ SOLN
INTRAMUSCULAR | Status: DC | PRN
Start: 2021-11-03 — End: 2021-11-03
  Administered 2021-11-03: 20 mL via PERINEURAL

## 2021-11-03 MED ORDER — SODIUM CHLORIDE FLUSH 0.9 % IV SOLN
INTRAVENOUS | Status: AC
  Filled 2021-11-03: qty 10

## 2021-11-03 MED ORDER — LIDOCAINE HCL (PF) 1 % IJ SOLN
INTRAMUSCULAR | Status: AC
  Filled 2021-11-03: qty 5

## 2021-11-03 MED ORDER — PHENYLEPHRINE HCL (PRESSORS) 10 MG/ML IV SOLN
INTRAVENOUS | Status: AC
  Filled 2021-11-03: qty 1

## 2021-11-03 MED ORDER — ONDANSETRON HCL 4 MG/2ML IJ SOLN: 4.0000 mg | Freq: Once | INTRAMUSCULAR | Status: DC | PRN

## 2021-11-03 MED ORDER — PHENYLEPHRINE HCL (PRESSORS) 10 MG/ML IV SOLN
INTRAVENOUS | Status: DC | PRN
  Administered 2021-11-03: 100 ug via INTRAVENOUS

## 2021-11-03 MED ORDER — DEXAMETHASONE SODIUM PHOSPHATE 10 MG/ML IJ SOLN
INTRAMUSCULAR | Status: AC
Start: 2021-11-03 — End: 2021-11-04
  Filled 2021-11-03: qty 1

## 2021-11-03 MED ORDER — PROPOFOL 10 MG/ML IV BOLUS
INTRAVENOUS | Status: DC | PRN
  Administered 2021-11-03: 140 mg via INTRAVENOUS

## 2021-11-03 SURGICAL SUPPLY — 44 items
ANCHOR SWIVELOCK BIO COMP (Anchor) ×1 IMPLANT
BAG COUNTER SPONGE SURGICOUNT (BAG) ×2 IMPLANT
BLADE MED AGGRESSIVE (BLADE) ×1 IMPLANT
BNDG COHESIVE 6X5 TAN ST LF (GAUZE/BANDAGES/DRESSINGS) ×1 IMPLANT
BNDG ELASTIC 4X5.8 VLCR STR LF (GAUZE/BANDAGES/DRESSINGS) ×2 IMPLANT
BNDG ELASTIC 6X5.8 VLCR STR LF (GAUZE/BANDAGES/DRESSINGS) ×2 IMPLANT
BNDG ESMARK 4X12 TAN STRL LF (GAUZE/BANDAGES/DRESSINGS) ×1 IMPLANT
BNDG GAUZE ELAST 4 BULKY (GAUZE/BANDAGES/DRESSINGS) ×2 IMPLANT
CANISTER WOUND CARE 500ML ATS (WOUND CARE) ×1 IMPLANT
COVER LIGHT HANDLE STERIS (MISCELLANEOUS) ×4 IMPLANT
DRAPE FLUOR MINI C-ARM 54X84 (DRAPES) ×1 IMPLANT
DRAPE U-SHAPE 47X51 STRL (DRAPES) ×2 IMPLANT
DRSG PAD ABDOMINAL 8X10 ST (GAUZE/BANDAGES/DRESSINGS) ×2 IMPLANT
DURAPREP 26ML APPLICATOR (WOUND CARE) ×2 IMPLANT
GAUZE SPONGE 4X4 12PLY STRL (GAUZE/BANDAGES/DRESSINGS) ×2 IMPLANT
GAUZE XEROFORM 1X8 LF (GAUZE/BANDAGES/DRESSINGS) ×2 IMPLANT
GLOVE SRG 8 PF TXTR STRL LF DI (GLOVE) ×1 IMPLANT
GLOVE SURG ORTHO LTX SZ7 (GLOVE) ×1 IMPLANT
GLOVE SURG UNDER POLY LF SZ8 (GLOVE) ×1
IMPL SYS BIOCOMP ACH SPEED (Anchor) IMPLANT
IMPLANT SYS BIOCOMP ACH SPEED (Anchor) ×2 IMPLANT
KIT BIO-TENODESIS 3X8 DISP (MISCELLANEOUS) ×1
KIT INSRT BABSR STRL DISP BTN (MISCELLANEOUS) IMPLANT
KIT PREVENA INCISION MGT20CM45 (CANNISTER) ×1 IMPLANT
NEEDLE HYPO 22GX1.5 SAFETY (NEEDLE) ×2 IMPLANT
NS IRRIG 1000ML POUR BTL (IV SOLUTION) ×2 IMPLANT
PACK BASIN MAJOR ARMC (MISCELLANEOUS) ×2 IMPLANT
PACK EXTREMITY ARMC (MISCELLANEOUS) ×2 IMPLANT
PENCIL SMOKE EVACUATOR (MISCELLANEOUS) IMPLANT
RASP SM TEAR CROSS CUT (RASP) ×1 IMPLANT
SPLINT CAST 1 STEP 4X30 (MISCELLANEOUS) ×1 IMPLANT
STAPLER VISISTAT 35W (STAPLE) ×2 IMPLANT
SUCTION FRAZIER HANDLE 10FR (MISCELLANEOUS) ×1
SUCTION TUBE FRAZIER 10FR DISP (MISCELLANEOUS) ×1 IMPLANT
SUT ETHILON 3-0 (SUTURE) ×1 IMPLANT
SUT ETHILON 4 0 PS 2 18 (SUTURE) ×2 IMPLANT
SUT MNCRL 3-0 UNDYED SH (SUTURE) IMPLANT
SUT MONOCRYL 3-0 UNDYED (SUTURE) ×2
SUT VIC AB 2-0 CT1 27 (SUTURE) ×1
SUT VIC AB 2-0 CT1 TAPERPNT 27 (SUTURE) IMPLANT
SUT VIC AB 3-0 PS2 18 (SUTURE) ×2 IMPLANT
SUT VIC AB 4-0 PS2 27 (SUTURE) ×2 IMPLANT
SYR CONTROL 10ML LL (SYRINGE) ×2 IMPLANT
TOWEL OR 17X26 4PK STRL BLUE (TOWEL DISPOSABLE) ×3 IMPLANT

## 2021-11-03 NOTE — Assessment & Plan Note (Signed)
-   no s/s exacerbation ?- denies tobacco use ?- continue inhalers ?

## 2021-11-03 NOTE — Assessment & Plan Note (Signed)
-   Denies CPAP use at home and declines CPAP in the hospital ?

## 2021-11-03 NOTE — Anesthesia Preprocedure Evaluation (Signed)
Anesthesia Evaluation  ?Patient identified by MRN, date of birth, ID band ?Patient awake ? ? ? ?Reviewed: ?Allergy & Precautions, H&P , NPO status , Patient's Chart, lab work & pertinent test results, reviewed documented beta blocker date and time  ? ?Airway ?Mallampati: II ? ?TM Distance: >3 FB ?Neck ROM: full ? ? ? Dental ? ?(+) Teeth Intact ?  ?Pulmonary ?shortness of breath and with exertion, asthma , sleep apnea , COPD,  ?  ?Pulmonary exam normal ? ? ? ? ? ? ? Cardiovascular ?Exercise Tolerance: Poor ?hypertension, On Medications ?Normal cardiovascular exam+ Valvular Problems/Murmurs  ?Rhythm:regular Rate:Normal ? ? ?  ?Neuro/Psych ?PSYCHIATRIC DISORDERS Depression negative neurological ROS ?   ? GI/Hepatic ?Neg liver ROS, GERD  Medicated,  ?Endo/Other  ?negative endocrine ROSdiabetes, Well Controlled, Type 1, Insulin Dependent ? Renal/GU ?negative Renal ROS  ?negative genitourinary ?  ?Musculoskeletal ? ? Abdominal ?  ?Peds ? Hematology ? ?(+) Blood dyscrasia, anemia ,   ?Anesthesia Other Findings ?Past Medical History: ?No date: Anemia ?No date: Arthritis ?No date: Asthma ?No date: COPD (chronic obstructive pulmonary disease) (HCC) ?No date: Cough ?    Comment:  CHRONIC ?No date: Depression ?No date: Diabetes mellitus without complication (HCC) ?    Comment:  HAS GLUCOSE SENSOR LEFT SHOULDER ?No date: Dyspnea ?No date: GERD (gastroesophageal reflux disease) ?No date: Heart murmur ?No date: Hypertension ?No date: Sleep apnea ?    Comment:  CPAP ?No date: Wheezing ?Past Surgical History: ?No date: ABDOMINAL HYSTERECTOMY ?1984: BREAST BIOPSY; Right ?    Comment:  neg ?09/15/2016: CATARACT EXTRACTION W/PHACO; Left ?    Comment:  Procedure: CATARACT EXTRACTION PHACO AND INTRAOCULAR  ?             LENS PLACEMENT (IOC);  Surgeon: Nevada Crane, MD;   ?             Location: [facility];  Service: Ophthalmology;  Laterality: ?             Left;  Lot# [id number] ?[place]: 00:48.9 ?AP%:  15.1 ?CDE: 7.39 ?11/10/2016: CATARACT EXTRACTION W/PHACO; Right ?    Comment:  Procedure: CATARACT EXTRACTION PHACO AND INTRAOCULAR  ?             LENS PLACEMENT (IOC);  Surgeon: Nevada Crane, MD;   ?             Location: [facility];  Service: Ophthalmology;  Laterality: ?             Right;  [place] 56.6 ?AP% 10.8 ?CDE 6.39 ?Fluid Pack lot #  ?             [id number] ?No date: FRACTURE SURGERY ?    Comment:  ORIF ANKLE ?03/27/2017: INCISION AND DRAINAGE; Left ?    Comment:  Procedure: INCISION AND DRAINAGE;  Surgeon: Odis Luster  ?             Dola Argyle, MD;  Location: [facility];  Service: Orthopedics;  ?             Laterality: Left; ?03/27/2017: ORIF WRIST FRACTURE; Left ?    Comment:  Procedure: OPEN REDUCTION INTERNAL FIXATION (ORIF) WRIST ?             FRACTURE ULNAR PINNING;  Surgeon: Lyndle Herrlich, MD;   ?             Location: [facility];  Service: Orthopedics;  Laterality:  ?  Left; ?BMI   ? Body Mass Index: 35.52 kg/m?  ?  ? Reproductive/Obstetrics ?negative OB ROS ? ?  ? ? ? ? ? ? ? ? ? ? ? ? ? ?  ?  ? ? ? ? ? ? ? ? ?Anesthesia Physical ?Anesthesia Plan ? ?ASA: 3 ? ?Anesthesia Plan: General ETT  ? ?Post-op Pain Management: Regional block*  ? ?Induction:  ? ?PONV Risk Score and Plan: 4 or greater ? ?Airway Management Planned:  ? ?Additional Equipment:  ? ?Intra-op Plan:  ? ?Post-operative Plan:  ? ?Informed Consent: I have reviewed the patients History and Physical, chart, labs and discussed the procedure including the risks, benefits and alternatives for the proposed anesthesia with the patient or authorized representative who has indicated his/her understanding and acceptance.  ? ? ? ?Dental Advisory Given ? ?Plan Discussed with: CRNA ? ?Anesthesia Plan Comments:   ? ? ? ? ? ? ?Anesthesia Quick Evaluation ? ?

## 2021-11-03 NOTE — Anesthesia Procedure Notes (Signed)
Anesthesia Regional Block: Popliteal block  ? ?Pre-Anesthetic Checklist: , timeout performed,  Correct Patient, Correct Site, Correct Laterality,  Correct Procedure, Correct Position, site marked,  Risks and benefits discussed,  Surgical consent,  Pre-op evaluation,  At surgeon's request and post-op pain management ? ?Laterality: Lower and Right ? ?Prep: chloraprep     ?  ?Needles:  ?Injection technique: Single-shot ? ?Needle Type: Echogenic Needle   ? ? ?Needle Length: 9cm  ?Needle Gauge: 21  ? ? ? ?Additional Needles: ? ? ?Procedures:,,,, ultrasound used (permanent image in chart),,    ?Narrative:  ?End time: 11/03/2021 1:22 PM ?Injection made incrementally with aspirations every 5 mL. ? ?Performed by: Personally  ?Anesthesiologist: Piscitello, Cleda Mccreedy, MD ? ?Additional Notes: ?Patient consented for risk and benefits of nerve block including but not limited to nerve damage, failed block, bleeding and infection.  Patient voiced understanding. ? ?Functioning IV was confirmed and monitors were applied.  Timeout done prior to procedure and prior to any sedation being given to the patient.  Patient confirmed procedure site prior to any sedation given to the patient.  A 83mm 22ga Stimuplex needle was used. Sterile prep,hand hygiene and sterile gloves were used.  Minimal sedation used for procedure.  No paresthesia endorsed by patient during the procedure.  Negative aspiration and negative test dose prior to incremental administration of local anesthetic. The patient tolerated the procedure well with no immediate complications. ? ? ? ?

## 2021-11-03 NOTE — Hospital Course (Addendum)
Isabella White is a 76 yo female with PMH HTN, IDDM, asthma, OSA, depression/anxiety who presented after a fall resulting in right ankle pain.  She had been having ongoing right foot pain and occasional sensation that her right leg would give out.  This again happened on admission and she fell.  She was visiting a family member in the ICU when she had fallen in the hallway. ?Initial x-ray showed an avulsion fracture of the posterior calcaneus at the Achilles tendon with displaced bone fragment appreciated.  This was confirmed on MRI.  She was evaluated by orthopedic surgery with recommendation for surgical repair. ?See below for further problem-based plan. ?

## 2021-11-03 NOTE — Treatment Plan (Signed)
Called patient cell phone and via hospital room phone to discuss surgical plan and review MRI results, was unable to reach her. I will see her later today prior to surgery in the pre operative holding area. Plan  will be for repair of Achilles avulsion fracture, resection of prominent bone in heel, possible calf  muscle lengthening and FHL transfer. Maintain NWB and NPO. PT consult placed for tomorrow  ? ?Lanae Crumbly, DPM ?11/03/2021 ? ? ? ?

## 2021-11-03 NOTE — Anesthesia Postprocedure Evaluation (Signed)
Anesthesia Post Note ? ?Patient: Isabella White ? ?Procedure(s) Performed: ACHILLES TENDON REPAIR (Right) ? ?Patient location during evaluation: PACU ?Anesthesia Type: General ?Level of consciousness: awake and alert, oriented and patient cooperative ?Pain management: pain level controlled ?Vital Signs Assessment: post-procedure vital signs reviewed and stable ?Respiratory status: spontaneous breathing, nonlabored ventilation and respiratory function stable ?Cardiovascular status: blood pressure returned to baseline and stable ?Postop Assessment: adequate PO intake ?Anesthetic complications: no ? ? ?No notable events documented. ? ? ?Last Vitals:  ?Vitals:  ? 11/03/21 1630 11/03/21 1645  ?BP: (!) 144/68 130/64  ?Pulse: 97 83  ?Resp: 18 18  ?Temp:    ?SpO2: 98% 96%  ?  ?Last Pain:  ?Vitals:  ? 11/03/21 1645  ?TempSrc:   ?PainSc: Asleep  ? ? ?  ?  ?  ?  ?  ?  ? ?Reed Breech ? ? ? ? ?

## 2021-11-03 NOTE — Assessment & Plan Note (Addendum)
-   s/p mechanical fall ?- imaging shows avulsion fracture of right calcaneus with retraction of Achilles tendon from its insertion ?- s/p surgical repair on 3/15 ?- plan is for NWB to RLE and continue Prevena VAC at discharge with outpt follow up with podiatry in 1 week ?- s/p angiography on 3/17 (no critical stenosis or intervention needed) ?- Continue pain control ?-Bed accepted at [facility].  Okay to discharge on Monday ?

## 2021-11-03 NOTE — Assessment & Plan Note (Addendum)
-   A1c 6.7% ?- Home Levemir reduced from 25 units down to 15 units at time of discharge.  May need further adjustment ?- Continue carb consistent diet ?

## 2021-11-03 NOTE — Anesthesia Procedure Notes (Signed)
Procedure Name: Intubation ?Date/Time: 11/03/2021 1:43 PM ?Performed by: Beverely Low, CRNA ?Pre-anesthesia Checklist: Patient identified, Patient being monitored, Timeout performed, Emergency Drugs available and Suction available ?Patient Re-evaluated:Patient Re-evaluated prior to induction ?Oxygen Delivery Method: Circle system utilized ?Preoxygenation: Pre-oxygenation with 100% oxygen ?Induction Type: IV induction ?Ventilation: Mask ventilation without difficulty ?Laryngoscope Size: 3 and McGraph ?Grade View: Grade I ?Tube type: Oral ?Tube size: 7.0 mm ?Number of attempts: 1 ?Airway Equipment and Method: Stylet ?Placement Confirmation: ETT inserted through vocal cords under direct vision, positive ETCO2 and breath sounds checked- equal and bilateral ?Secured at: 21 cm ?Tube secured with: Tape ?Dental Injury: Teeth and Oropharynx as per pre-operative assessment  ? ? ? ? ?

## 2021-11-03 NOTE — Progress Notes (Signed)
History and Physical Interval Note: ? ?11/03/2021 ?1:20 PM ? ?Isabella White  has presented today for surgery, with the diagnosis of Achilles tendon avulsion rupture/fracture.  The various methods of treatment have been discussed with the patient and family. After consideration of risks, benefits and other options for treatment, the patient has consented to   ?Procedure(s): ?ACHILLES TENDON REPAIR (Right) as a surgical intervention.  The patient's history has been reviewed, patient examined, no change in status, stable for surgery.  I have reviewed the patient's chart and labs.  Questions were answered to the patient's satisfaction.   ? ? ?Rachelle Hora Monserath Neff ?  ?

## 2021-11-03 NOTE — Progress Notes (Signed)
?Progress Note ? ? ? ?[name]   ?HWK:[id number]  ?DOB: 05/24/1946  ?DOA: 11/02/2021     1 ?PCP: Lynnea Ferrier, MD ? ?Initial CC: fall ? ?Hospital Course: ?Ms. Saintil is a 76 yo female with PMH HTN, IDDM, asthma, OSA, depression/anxiety who presented after a fall resulting in right ankle pain.  She had been having ongoing right foot pain and occasional sensation that her right leg would give out.  This again happened on admission and she fell.  She was visiting a family member in the ICU when she had fallen in the hallway. ?Initial x-ray showed an avulsion fracture of the posterior calcaneus at the Achilles tendon with displaced bone fragment appreciated.  This was confirmed on MRI.  She was evaluated by orthopedic surgery with recommendation for surgical repair. ? ?Interval History:  ?Seen this morning in her room prior to surgery.  Right foot noted to have swelling along the Achilles tendon area and significant bruising.  Pain was controlled. ? ?Assessment and Plan: ?* Avulsion fracture of right calcaneus ?- s/p mechanical fall ?- imaging shows avulsion fracture of right calcaneus with retraction of Achilles tendon from its insertion ?-Undergoing repair with orthopedic surgery on 11/03/2021 ?- Continue pain control ? ?Depression ?- Continue Zoloft ? ?Hypertension ?- Continue Norvasc ?- ARB on hold.  Can likely resume after surgery ? ?COPD (chronic obstructive pulmonary disease) (HCC) ?- no s/s exacerbation ?- denies tobacco use ?- continue inhalers ? ?Sleep apnea ?- Denies CPAP use at home and declines CPAP in the hospital ? ?Diabetes mellitus without complication (HCC) ?- Continue SSI and CBG monitoring ? ? ?Old records reviewed in assessment of this patient ? ?Antimicrobials: ? ? ?DVT prophylaxis:  ?SCDs Start: 11/02/21 1925 ? ? ?Code Status:   Code Status: Full Code ? ?Disposition Plan:  Pending surgery but patient preferring home at d/c ?Status is: Inpt ? ?Objective: ?Blood pressure (!) 144/84, pulse  80, temperature 99.4 ?F (37.4 ?C), temperature source Tympanic, resp. rate 20, height 5\' 1"  (1.549 m), weight 85.3 kg, SpO2 95 %.  ?Examination:  ?Physical Exam ?Constitutional:   ?   Appearance: Normal appearance.  ?HENT:  ?   Head: Normocephalic and atraumatic.  ?   Mouth/Throat:  ?   Mouth: Mucous membranes are moist.  ?Eyes:  ?   Extraocular Movements: Extraocular movements intact.  ?Cardiovascular:  ?   Rate and Rhythm: Normal rate and regular rhythm.  ?Pulmonary:  ?   Effort: Pulmonary effort is normal.  ?   Breath sounds: Normal breath sounds.  ?Abdominal:  ?   General: Bowel sounds are normal. There is no distension.  ?   Palpations: Abdomen is soft.  ?   Tenderness: There is no abdominal tenderness.  ?Musculoskeletal:  ?   Cervical back: Normal range of motion and neck supple.  ?   Comments: Right foot TTP along achilles area; bruising noted along same area  ?Skin: ?   General: Skin is warm and dry.  ?Neurological:  ?   General: No focal deficit present.  ?   Mental Status: She is alert.  ?Psychiatric:     ?   Mood and Affect: Mood normal.     ?   Behavior: Behavior normal.  ?  ? ?Consultants:  ?Orthopedic surgery ? ?Procedures:  ? ? ?Data Reviewed: ?Results for orders placed or performed during the hospital encounter of 11/02/21 (from the past 24 hour(s))  ?Comprehensive metabolic panel     Status: Abnormal  ?  Collection Time: 11/02/21  7:35 PM  ?Result Value Ref Range  ? Sodium 133 (L) 135 - 145 mmol/L  ? Potassium 3.3 (L) 3.5 - 5.1 mmol/L  ? Chloride 98 98 - 111 mmol/L  ? CO2 28 22 - 32 mmol/L  ? Glucose, Bld 154 (H) 70 - 99 mg/dL  ? BUN 12 8 - 23 mg/dL  ? Creatinine, Ser 0.79 0.44 - 1.00 mg/dL  ? Calcium 8.9 8.9 - 10.3 mg/dL  ? Total Protein 7.8 6.5 - 8.1 g/dL  ? Albumin 4.1 3.5 - 5.0 g/dL  ? AST 27 15 - 41 U/L  ? ALT 18 0 - 44 U/L  ? Alkaline Phosphatase 95 38 - 126 U/L  ? Total Bilirubin 0.9 0.3 - 1.2 mg/dL  ? GFR, Estimated >60 >60 mL/min  ? Anion gap 7 5 - 15  ?CBC     Status: None  ? Collection  Time: 11/02/21  7:35 PM  ?Result Value Ref Range  ? WBC 8.2 4.0 - 10.5 K/uL  ? RBC 5.07 3.87 - 5.11 MIL/uL  ? Hemoglobin 13.7 12.0 - 15.0 g/dL  ? HCT 43.5 36.0 - 46.0 %  ? MCV 85.8 80.0 - 100.0 fL  ? MCH 27.0 26.0 - 34.0 pg  ? MCHC 31.5 30.0 - 36.0 g/dL  ? RDW 14.6 11.5 - 15.5 %  ? Platelets 189 150 - 400 K/uL  ? nRBC 0.0 0.0 - 0.2 %  ?Glucose, capillary     Status: Abnormal  ? Collection Time: 11/02/21  8:20 PM  ?Result Value Ref Range  ? Glucose-Capillary 146 (H) 70 - 99 mg/dL  ?Glucose, capillary     Status: Abnormal  ? Collection Time: 11/03/21 12:03 AM  ?Result Value Ref Range  ? Glucose-Capillary 204 (H) 70 - 99 mg/dL  ? Comment 1 Notify RN   ?Glucose, capillary     Status: Abnormal  ? Collection Time: 11/03/21  3:26 AM  ?Result Value Ref Range  ? Glucose-Capillary 125 (H) 70 - 99 mg/dL  ?Basic metabolic panel     Status: Abnormal  ? Collection Time: 11/03/21  3:51 AM  ?Result Value Ref Range  ? Sodium 138 135 - 145 mmol/L  ? Potassium 4.2 3.5 - 5.1 mmol/L  ? Chloride 104 98 - 111 mmol/L  ? CO2 28 22 - 32 mmol/L  ? Glucose, Bld 126 (H) 70 - 99 mg/dL  ? BUN 12 8 - 23 mg/dL  ? Creatinine, Ser 0.82 0.44 - 1.00 mg/dL  ? Calcium 9.1 8.9 - 10.3 mg/dL  ? GFR, Estimated >60 >60 mL/min  ? Anion gap 6 5 - 15  ?Magnesium     Status: None  ? Collection Time: 11/03/21  3:51 AM  ?Result Value Ref Range  ? Magnesium 2.1 1.7 - 2.4 mg/dL  ?Glucose, capillary     Status: Abnormal  ? Collection Time: 11/03/21  8:24 AM  ?Result Value Ref Range  ? Glucose-Capillary 114 (H) 70 - 99 mg/dL  ?Glucose, capillary     Status: Abnormal  ? Collection Time: 11/03/21 11:53 AM  ?Result Value Ref Range  ? Glucose-Capillary 161 (H) 70 - 99 mg/dL  ? Comment 1 Notify RN   ? Comment 2 Document in Chart   ?  ?I have Reviewed nursing notes, Vitals, and Lab results since pt's last encounter. Pertinent lab results : see above ?I have ordered test including BMP, CBC, Mg ?I have reviewed the last note from staff over past 24 hours ?I  have discussed pt's  care plan and test results with nursing staff, case manager ? ? LOS: 1 day  ? ?Lewie Chamberavid Dionicia Cerritos, MD ?[facility] ?11/03/2021, 2:45 PM ? ?

## 2021-11-03 NOTE — Assessment & Plan Note (Addendum)
Continue losartan and Norvasc 

## 2021-11-03 NOTE — Plan of Care (Signed)
?  Problem: Health Behavior/Discharge Planning: ?Goal: Ability to manage health-related needs will improve ?Outcome: Progressing ?  ?Problem: Clinical Measurements: ?Goal: Ability to maintain clinical measurements within normal limits will improve ?Outcome: Progressing ?Goal: Will remain free from infection ?Outcome: Progressing ?Goal: Diagnostic test results will improve ?Outcome: Progressing ?Goal: Respiratory complications will improve ?Outcome: Progressing ?Goal: Cardiovascular complication will be avoided ?Outcome: Progressing ?  ?Problem: Clinical Measurements: ?Goal: Will remain free from infection ?Outcome: Progressing ?  ?Problem: Clinical Measurements: ?Goal: Diagnostic test results will improve ?Outcome: Progressing ?  ?Problem: Clinical Measurements: ?Goal: Respiratory complications will improve ?Outcome: Progressing ?  ?Problem: Clinical Measurements: ?Goal: Cardiovascular complication will be avoided ?Outcome: Progressing ?  ?Problem: Activity: ?Goal: Risk for activity intolerance will decrease ?Outcome: Progressing ?  ?

## 2021-11-03 NOTE — TOC Initial Note (Signed)
Transition of Care (TOC) - Initial/Assessment Note  ? ? ?Patient Details  ?Name: Isabella White ?MRN: [id number] ?Date of Birth: April 28, 1946 ? ?Transition of Care (TOC) CM/SW Contact:    ?Marlowe Sax, RN ?Phone Number: ?11/03/2021, 9:11 AM ? ?Clinical Narrative:       Patient lives alone, she was visiting a relative at the hospital and fell,   She is to have surgery this afternoon, she will have family stay with her after surgery, She is looking at getting a knee scooter, TOC to assist in DC planning and needs        ? ? ?  ?  ? ? ?Patient Goals and CMS Choice ?  ?  ?  ? ?Expected Discharge Plan and Services ?  ?  ?  ?  ?  ?                ?  ?  ?  ?  ?  ?  ?  ?  ?  ?  ? ?Prior Living Arrangements/Services ?  ?  ?  ?       ?  ?  ?  ?  ? ?Activities of Daily Living ?Home Assistive Devices/Equipment: Cane (specify quad or straight), Eyeglasses, Walker (specify type) (two types, one with wheels and one without wheels) ?ADL Screening (condition at time of admission) ?Patient's cognitive ability adequate to safely complete daily activities?: Yes ?Is the patient deaf or have difficulty hearing?: No ?Does the patient have difficulty seeing, even when wearing glasses/contacts?: No ?Does the patient have difficulty concentrating, remembering, or making decisions?: No ?Patient able to express need for assistance with ADLs?: Yes ?Does the patient have difficulty dressing or bathing?: No ?Independently performs ADLs?: Yes (appropriate for developmental age) ?Does the patient have difficulty walking or climbing stairs?: No ?Weakness of Legs: None ?Weakness of Arms/Hands: None ? ?Permission Sought/Granted ?  ?  ?   ?   ?   ?   ? ?Emotional Assessment ?  ?  ?  ?  ?  ?  ? ?Admission diagnosis:  Avulsion fracture of right calcaneus [S92.001A] ?Encounter for preoperative assessment [Z01.818] ?Patient Active Problem List  ? Diagnosis Date Noted  ? Avulsion fracture of right calcaneus 11/02/2021  ? Diabetes mellitus without  complication (HCC)   ? Sleep apnea   ? COPD (chronic obstructive pulmonary disease) (HCC)   ? Hypertension   ? Depression   ? ?PCP:  Lynnea Ferrier, MD ?Pharmacy:   ?[id number] - [place], [place] - [address] AT [facility] OF [address] ?[address] ?[place] [address] ?Phone: [phone] Fax: [phone] ? ? ? ? ?Social Determinants of Health (SDOH) Interventions ?  ? ?Readmission Risk Interventions ?No flowsheet data found. ? ? ?

## 2021-11-03 NOTE — Transfer of Care (Addendum)
Immediate Anesthesia Transfer of Care Note ? ?Patient: Isabella White ? ?Procedure(s) Performed: ACHILLES TENDON REPAIR (Right) ? ?Patient Location: PACU ? ?Anesthesia Type:General ? ?Level of Consciousness: drowsy ? ?Airway & Oxygen Therapy: Patient Spontanous Breathing and Patient connected to face mask oxygen ? ?Post-op Assessment: Report given to RN and Post -op Vital signs reviewed and stable ? ?Post vital signs: Reviewed and stable ? ?Last Vitals:  ?Vitals Value Taken Time  ?BP 143/69 11/03/21 1700  ?Temp 36.7 ?C 11/03/21 1615  ?Pulse 84 11/03/21 1703  ?Resp 21 11/03/21 1703  ?SpO2 98 % 11/03/21 1703  ?Vitals shown include unvalidated device data. ? ?Last Pain:  ?Vitals:  ? 11/03/21 1645  ?TempSrc:   ?PainSc: Asleep  ?   ? ?Patients Stated Pain Goal: 0 (11/03/21 1038) ? ?Complications: No notable events documented. ?

## 2021-11-03 NOTE — Assessment & Plan Note (Signed)
Continue Zoloft 

## 2021-11-03 NOTE — Brief Op Note (Signed)
11/03/2021 ? ?4:20 PM ? ?PATIENT:  Isabella White  76 y.o. female ? ?PRE-OPERATIVE DIAGNOSIS:  Achilles Tendon Rupture ? ?POST-OPERATIVE DIAGNOSIS:  Achilles Tendon Rupture and avulsion, severe tendinosis, Haglund's deformity, equinus deformity ? ?PROCEDURE:  Procedure(s): ?ACHILLES TENDON REPAIR (Right) ?Resection Haglund deformity ?FHL transfer to calcaneus ?Gastrocnemius recession ? ?SURGEON:  Surgeon(s) and Role: ?   * Nazario Russom, Rachelle Hora, DPM - Primary ? ? ?ASSISTANTS: none  ? ?ANESTHESIA:   regional and general ? ?EBL:  1 mL  ? ?BLOOD ADMINISTERED:none ? ?DRAINS: incisional VAC  ? ?LOCAL MEDICATIONS USED:  NONE ? ?SPECIMEN:  distal Achilles tendon and bone ? ?DISPOSITION OF SPECIMEN:  PATHOLOGY ? ?COUNTS:  YES ? ?TOURNIQUET:   ?Total Tourniquet Time Documented: ?Thigh (Right) - 80 minutes ?Total: Thigh (Right) - 80 minutes ? ? ?DICTATION: .Note written in EPIC ? ?PLAN OF CARE: Admit to inpatient  ? ?PATIENT DISPOSITION:  PACU - hemodynamically stable. ?  ?Delay start of Pharmacological VTE agent (>24hrs) due to surgical blood loss or risk of bleeding: yes ? ? ?DP pulse palpable and strong doppler, PT was not palpable and had a monophasic doppler signal. I discussed her case with Dr Wyn Quaker from Vascular Surgery and he has recommended LE angiogram, more plans per their team ? ?

## 2021-11-04 ENCOUNTER — Encounter: Payer: Self-pay | Admitting: Podiatry

## 2021-11-04 DIAGNOSIS — S86019A Strain of unspecified Achilles tendon, initial encounter: Secondary | ICD-10-CM

## 2021-11-04 DIAGNOSIS — I739 Peripheral vascular disease, unspecified: Secondary | ICD-10-CM

## 2021-11-04 DIAGNOSIS — M9261 Juvenile osteochondrosis of tarsus, right ankle: Secondary | ICD-10-CM | POA: Diagnosis not present

## 2021-11-04 DIAGNOSIS — M6788 Other specified disorders of synovium and tendon, other site: Secondary | ICD-10-CM | POA: Diagnosis not present

## 2021-11-04 DIAGNOSIS — E1151 Type 2 diabetes mellitus with diabetic peripheral angiopathy without gangrene: Secondary | ICD-10-CM

## 2021-11-04 DIAGNOSIS — S86011D Strain of right Achilles tendon, subsequent encounter: Secondary | ICD-10-CM | POA: Diagnosis not present

## 2021-11-04 DIAGNOSIS — S92031A Displaced avulsion fracture of tuberosity of right calcaneus, initial encounter for closed fracture: Secondary | ICD-10-CM | POA: Diagnosis not present

## 2021-11-04 DIAGNOSIS — M21861 Other specified acquired deformities of right lower leg: Secondary | ICD-10-CM | POA: Diagnosis not present

## 2021-11-04 LAB — CBC WITH DIFFERENTIAL/PLATELET
Abs Immature Granulocytes: 0.03 10*3/uL (ref 0.00–0.07)
Basophils Absolute: 0 10*3/uL (ref 0.0–0.1)
Basophils Relative: 0 %
Eosinophils Absolute: 0 10*3/uL (ref 0.0–0.5)
Eosinophils Relative: 0 %
HCT: 40.3 % (ref 36.0–46.0)
Hemoglobin: 12.7 g/dL (ref 12.0–15.0)
Immature Granulocytes: 0 %
Lymphocytes Relative: 6 %
Lymphs Abs: 0.6 10*3/uL — ABNORMAL LOW (ref 0.7–4.0)
MCH: 27.2 pg (ref 26.0–34.0)
MCHC: 31.5 g/dL (ref 30.0–36.0)
MCV: 86.3 fL (ref 80.0–100.0)
Monocytes Absolute: 0.5 10*3/uL (ref 0.1–1.0)
Monocytes Relative: 6 %
Neutro Abs: 7.7 10*3/uL (ref 1.7–7.7)
Neutrophils Relative %: 88 %
Platelets: 180 10*3/uL (ref 150–400)
RBC: 4.67 MIL/uL (ref 3.87–5.11)
RDW: 14.5 % (ref 11.5–15.5)
WBC: 8.8 10*3/uL (ref 4.0–10.5)
nRBC: 0 % (ref 0.0–0.2)

## 2021-11-04 LAB — GLUCOSE, CAPILLARY
Glucose-Capillary: 162 mg/dL — ABNORMAL HIGH (ref 70–99)
Glucose-Capillary: 175 mg/dL — ABNORMAL HIGH (ref 70–99)
Glucose-Capillary: 155 mg/dL — ABNORMAL HIGH (ref 70–99)
Glucose-Capillary: 135 mg/dL — ABNORMAL HIGH (ref 70–99)
Glucose-Capillary: 200 mg/dL — ABNORMAL HIGH (ref 70–99)
Glucose-Capillary: 167 mg/dL — ABNORMAL HIGH (ref 70–99)

## 2021-11-04 LAB — BASIC METABOLIC PANEL
Anion gap: 12 (ref 5–15)
BUN: 14 mg/dL (ref 8–23)
CO2: 23 mmol/L (ref 22–32)
Calcium: 8.7 mg/dL — ABNORMAL LOW (ref 8.9–10.3)
Chloride: 103 mmol/L (ref 98–111)
Creatinine, Ser: 0.96 mg/dL (ref 0.44–1.00)
GFR, Estimated: >60 mL/min (ref >60)
Glucose, Bld: 132 mg/dL — ABNORMAL HIGH (ref 70–99)
Potassium: 4.2 mmol/L (ref 3.5–5.1)
Sodium: 138 mmol/L (ref 135–145)

## 2021-11-04 LAB — HEMOGLOBIN A1C
Hgb A1c MFr Bld: 6.7 % — ABNORMAL HIGH (ref 4.8–5.6)
Mean Plasma Glucose: 146 mg/dL

## 2021-11-04 LAB — MAGNESIUM: Magnesium: 2 mg/dL (ref 1.7–2.4)

## 2021-11-04 MED ORDER — SODIUM CHLORIDE 0.9 % IV SOLN: INTRAVENOUS | Status: DC

## 2021-11-04 MED ORDER — CEFAZOLIN SODIUM-DEXTROSE 2-4 GM/100ML-% IV SOLN: 2.0000 g | INTRAVENOUS | Status: DC

## 2021-11-04 NOTE — Evaluation (Signed)
Physical Therapy Evaluation ?Patient Details ?Name: Isabella White ?MRN: [id number] ?DOB: Apr 25, 1946 ?Today's Date: 11/04/2021 ? ?History of Present Illness ? RILDA BULLS is a pleasant 76 y.o. female with medical history significant for hypertension, insulin-dependent diabetes mellitus, asthma, OSA, depression, and anxiety, now presenting to the emergency department with right ankle pain.  Patient has been seen by podiatry recently for right foot pain and has been using a cane due to this, but had otherwise been in her usual state of health when she had her right ankle "give out" today, leading to a fall and acute right ankle pain.  She denies hitting her head or suffering any other injury.  She denies any recent illness, is fully independent, and never experiences angina. Patient is s/p repair of calcaneal avulsion fx and achillies tendon rupture. NWB R LE ?  ?Clinical Impression ? Patient received in bed. Agrees to PT session. She voiced that she has questions for surgeon but has not seen him. RN aware. Patient required min assist for supine to sit. Min assist for sit to stand from slightly elevated bed. Min/mod assist for transfer from bed to recliner. She will continue to benefit from skilled PT while here to improve functional mobility, independence, safety and strength.     ?   ? ?Recommendations for follow up therapy are one component of a multi-disciplinary discharge planning process, led by the attending physician.  Recommendations may be updated based on patient status, additional functional criteria and insurance authorization. ? ?Follow Up Recommendations Skilled nursing-short term rehab (<3 hours/day) ? ?  ?Assistance Recommended at Discharge Intermittent Supervision/Assistance  ?Patient can return home with the following ? A little help with bathing/dressing/bathroom;A lot of help with walking and/or transfers;Help with stairs or ramp for entrance ? ?  ?Equipment Recommendations None recommended by PT   ?Recommendations for Other Services ?    ?  ?Functional Status Assessment Patient has had a recent decline in their functional status and demonstrates the ability to make significant improvements in function in a reasonable and predictable amount of time.  ? ?  ?Precautions / Restrictions Precautions ?Precautions: Fall ?Restrictions ?Weight Bearing Restrictions: Yes ?RLE Weight Bearing: Non weight bearing  ? ?  ? ?Mobility ? Bed Mobility ?Overal bed mobility: Needs Assistance ?Bed Mobility: Supine to Sit ?  ?  ?Supine to sit: Min assist ?  ?  ?General bed mobility comments: min assist to raise trunk to seated position ?  ? ?Transfers ?Overall transfer level: Needs assistance ?Equipment used: Rolling walker (2 wheels) ?Transfers: Sit to/from Stand, Bed to chair/wheelchair/BSC ?Sit to Stand: Min assist, From elevated surface ?Stand pivot transfers: Min assist ?  ?  ?  ?  ?General transfer comment: Patient requires cues for hand placement with sit to stand. Min assist to advance RW during pivot. ?  ? ?Ambulation/Gait ?  ?  ?  ?  ?  ?  ?  ?General Gait Details: unable ? ?Stairs ?  ?  ?  ?  ?  ? ?Wheelchair Mobility ?  ? ?Modified Rankin (Stroke Patients Only) ?  ? ?  ? ?Balance Overall balance assessment: Needs assistance ?Sitting-balance support: Feet unsupported, Bilateral upper extremity supported ?Sitting balance-Leahy Scale: Fair ?  ?  ?Standing balance support: Bilateral upper extremity supported, During functional activity, Reliant on assistive device for balance ?Standing balance-Leahy Scale: Poor ?Standing balance comment: min assist needed for balance/safety ?  ?  ?  ?  ?  ?  ?  ?  ?  ?  ?  ?   ? ? ? ?  Pertinent Vitals/Pain Pain Assessment ?Pain Assessment: Faces ?Faces Pain Scale: Hurts a little bit ?Pain Location: R LE ?Pain Descriptors / Indicators: Discomfort, Throbbing ?Pain Intervention(s): Monitored during session, Limited activity within patient's tolerance, Repositioned  ? ? ?Home Living  Family/patient expects to be discharged to:: Skilled nursing facility ?Living Arrangements: Alone ?  ?  ?  ?  ?  ?  ?  ?  ?   ?  ?Prior Function Prior Level of Function : Independent/Modified Independent ?  ?  ?  ?  ?  ?  ?Mobility Comments: used cane prior to admssion ?ADLs Comments: independent ?  ? ? ?Hand Dominance  ?   ? ?  ?Extremity/Trunk Assessment  ? Upper Extremity Assessment ?Upper Extremity Assessment: Defer to OT evaluation ?  ? ?Lower Extremity Assessment ?Lower Extremity Assessment: RLE deficits/detail ?RLE Coordination: decreased gross motor ?  ? ?Cervical / Trunk Assessment ?Cervical / Trunk Assessment: Normal  ?Communication  ? Communication: No difficulties  ?Cognition Arousal/Alertness: Awake/alert ?Behavior During Therapy: [facility] for tasks assessed/performed, Anxious ?Overall Cognitive Status: Within Functional Limits for tasks assessed ?  ?  ?  ?  ?  ?  ?  ?  ?  ?  ?  ?  ?  ?  ?  ?  ?  ?  ?  ? ?  ?General Comments   ? ?  ?Exercises    ? ?Assessment/Plan  ?  ?PT Assessment Patient needs continued PT services  ?PT Problem List Decreased strength;Decreased mobility;Decreased activity tolerance;Decreased balance;Decreased knowledge of use of DME;Pain ? ?   ?  ?PT Treatment Interventions DME instruction;Gait training;Therapeutic exercise;Balance training;Functional mobility training;Therapeutic activities;Patient/family education   ? ?PT Goals (Current goals can be found in the Care Plan section)  ?Acute Rehab PT Goals ?Patient Stated Goal: to go to rehab then home ?PT Goal Formulation: With patient ?Time For Goal Achievement: 11/18/21 ?Potential to Achieve Goals: Good ? ?  ?Frequency BID ?  ? ? ?Co-evaluation   ?  ?  ?  ?  ? ? ?  ?AM-PAC PT "6 Clicks" Mobility  ?Outcome Measure Help needed turning from your back to your side while in a flat bed without using bedrails?: A Little ?Help needed moving from lying on your back to sitting on the side of a flat bed without using bedrails?: A Little ?Help  needed moving to and from a bed to a chair (including a wheelchair)?: A Little ?Help needed standing up from a chair using your arms (e.g., wheelchair or bedside chair)?: A Little ?Help needed to walk in hospital room?: Total ?Help needed climbing 3-5 steps with a railing? : Total ?6 Click Score: 14 ? ?  ?End of Session Equipment Utilized During Treatment: Gait belt ?Activity Tolerance: Patient limited by pain;Patient limited by fatigue ?Patient left: in chair;with call bell/phone within reach;with SCD's reapplied ?Nurse Communication: Mobility status ?PT Visit Diagnosis: Unsteadiness on feet (R26.81);Muscle weakness (generalized) (M62.81);Other abnormalities of gait and mobility (R26.89);History of falling (Z91.81);Pain ?Pain - Right/Left: Right ?Pain - part of body: Ankle and joints of foot ?  ? ?Time: 3016-0109 ?PT Time Calculation (min) (ACUTE ONLY): 27 min ? ? ?Charges:   PT Evaluation ?$PT Eval Moderate Complexity: 1 Mod ?PT Treatments ?$Therapeutic Activity: 8-22 mins ?  ?   ? ? ?Lissa Merlin, PT, GCS ?11/04/21,11:22 AM ? ? ?

## 2021-11-04 NOTE — Plan of Care (Signed)
  Problem: Health Behavior/Discharge Planning: Goal: Ability to manage health-related needs will improve Outcome: Progressing   Problem: Clinical Measurements: Goal: Ability to maintain clinical measurements within normal limits will improve Outcome: Progressing Goal: Will remain free from infection Outcome: Progressing Goal: Diagnostic test results will improve Outcome: Progressing Goal: Respiratory complications will improve Outcome: Progressing Goal: Cardiovascular complication will be avoided Outcome: Progressing   Problem: Activity: Goal: Risk for activity intolerance will decrease Outcome: Progressing   Problem: Nutrition: Goal: Adequate nutrition will be maintained Outcome: Progressing   Problem: Coping: Goal: Level of anxiety will decrease Outcome: Progressing   Problem: Pain Managment: Goal: General experience of comfort will improve Outcome: Progressing   Problem: Safety: Goal: Ability to remain free from injury will improve Outcome: Progressing   Problem: Skin Integrity: Goal: Risk for impaired skin integrity will decrease Outcome: Progressing   

## 2021-11-04 NOTE — Progress Notes (Signed)
PT Cancellation Note ? ?Patient Details ?Name: Isabella White ?MRN: [id number] ?DOB: March 04, 1946 ? ? ?Cancelled Treatment:    Reason Eval/Treat Not Completed: Fatigue/lethargy limiting ability to participate. Patient had just gotten back to bed with OT. She is tired at this time and would like to rest. Patient going for vascular assessment on R LE tomorrow. Will continue to follow.    ? ? ?Cathrine Krizan ?11/04/2021, 2:29 PM ?

## 2021-11-04 NOTE — Progress Notes (Signed)
?Progress Note ? ? ? ?Isabella White   ?[id number]  ?DOB: 14-Apr-1946  ?DOA: 11/02/2021     2 ?PCP: Adin Hector, MD ? ?Initial CC: fall ? ?Hospital Course: ?Ms. Headlee is a 76 yo female with PMH HTN, IDDM, asthma, OSA, depression/anxiety who presented after a fall resulting in right ankle pain.  She had been having ongoing right foot pain and occasional sensation that her right leg would give out.  This again happened on admission and she fell.  She was visiting a family member in the ICU when she had fallen in the hallway. ?Initial x-ray showed an avulsion fracture of the posterior calcaneus at the Achilles tendon with displaced bone fragment appreciated.  This was confirmed on MRI.  She was evaluated by orthopedic surgery with recommendation for surgical repair. ? ?Interval History:  ?No events overnight.  Patient was aggravated over procedure scheduled for tomorrow and did not understand details of the procedure despite my explanation multiple times.  She also reiterated that she is a Jehovah witness and was adamantly against receiving blood ever.  This was seeming to frustrate her as she was worried she would need blood for the procedure tomorrow which I told her was not an expected anticipation but that we would honor her wishes. ? ?Assessment and Plan: ?* Avulsion fracture of right calcaneus ?- s/p mechanical fall ?- imaging shows avulsion fracture of right calcaneus with retraction of Achilles tendon from its insertion ?- s/p surgical repair on 3/15 ?- plan is for NWB to RLE and continue Prevena VAC at discharge with outpt follow up with podiatry in 1 week ?- next plan is for angiography with vascular on 3/17 ?- she is a Jehovah's Witness and adamantly against receiving blood ?- Continue pain control ? ?Depression ?- Continue Zoloft ? ?Hypertension ?- Continue Norvasc ?- ARB on hold.   Pressure still low/normal, continue holding ? ?COPD (chronic obstructive pulmonary disease) ([place]) ?- no s/s  exacerbation ?- denies tobacco use ?- continue inhalers ? ?Sleep apnea ?- Denies CPAP use at home and declines CPAP in the hospital ? ?Diabetes mellitus without complication ([place]) ?- Continue SSI and CBG monitoring ? ? ?Old records reviewed in assessment of this patient ? ?Antimicrobials: ? ? ?DVT prophylaxis:  ?SCDs Start: 11/02/21 1925 ? ? ?Code Status:   Code Status: Full Code ? ?Disposition Plan:  SNF/rehab possibly Fri or Sat if facility found ?Status is: Inpt ? ?Objective: ?Blood pressure (!) 128/53, pulse 86, temperature 98.2 ?F (36.8 ?C), resp. rate 18, height 5\' 1"  (1.549 m), weight 85.3 kg, SpO2 95 %.  ?Examination:  ?Physical Exam ?Constitutional:   ?   Appearance: Normal appearance.  ?HENT:  ?   Head: Normocephalic and atraumatic.  ?   Mouth/Throat:  ?   Mouth: Mucous membranes are moist.  ?Eyes:  ?   Extraocular Movements: Extraocular movements intact.  ?Cardiovascular:  ?   Rate and Rhythm: Normal rate and regular rhythm.  ?Pulmonary:  ?   Effort: Pulmonary effort is normal.  ?   Breath sounds: Normal breath sounds.  ?Abdominal:  ?   General: Bowel sounds are normal. There is no distension.  ?   Palpations: Abdomen is soft.  ?   Tenderness: There is no abdominal tenderness.  ?Musculoskeletal:  ?   Cervical back: Normal range of motion and neck supple.  ?   Comments: Right foot TTP along achilles area; bruising noted along same area  ?Skin: ?   General: Skin is  warm and dry.  ?Neurological:  ?   General: No focal deficit present.  ?   Mental Status: She is alert.  ?Psychiatric:     ?   Mood and Affect: Mood normal.     ?   Behavior: Behavior normal.  ?  ? ?Consultants:  ?Orthopedic surgery ? ?Procedures:  ? ? ?Data Reviewed: ?Results for orders placed or performed during the hospital encounter of 11/02/21 (from the past 24 hour(s))  ?Glucose, capillary     Status: Abnormal  ? Collection Time: 11/03/21  7:37 PM  ?Result Value Ref Range  ? Glucose-Capillary 264 (H) 70 - 99 mg/dL  ? Comment 1 Notify RN    ?Glucose, capillary     Status: Abnormal  ? Collection Time: 11/03/21 11:38 PM  ?Result Value Ref Range  ? Glucose-Capillary 300 (H) 70 - 99 mg/dL  ? Comment 1 Notify RN   ?Glucose, capillary     Status: Abnormal  ? Collection Time: 11/04/21  4:01 AM  ?Result Value Ref Range  ? Glucose-Capillary 162 (H) 70 - 99 mg/dL  ?Basic metabolic panel     Status: Abnormal  ? Collection Time: 11/04/21  6:24 AM  ?Result Value Ref Range  ? Sodium 138 135 - 145 mmol/L  ? Potassium 4.2 3.5 - 5.1 mmol/L  ? Chloride 103 98 - 111 mmol/L  ? CO2 23 22 - 32 mmol/L  ? Glucose, Bld 132 (H) 70 - 99 mg/dL  ? BUN 14 8 - 23 mg/dL  ? Creatinine, Ser 0.96 0.44 - 1.00 mg/dL  ? Calcium 8.7 (L) 8.9 - 10.3 mg/dL  ? GFR, Estimated >60 >60 mL/min  ? Anion gap 12 5 - 15  ?CBC with Differential/Platelet     Status: Abnormal  ? Collection Time: 11/04/21  6:24 AM  ?Result Value Ref Range  ? WBC 8.8 4.0 - 10.5 K/uL  ? RBC 4.67 3.87 - 5.11 MIL/uL  ? Hemoglobin 12.7 12.0 - 15.0 g/dL  ? HCT 40.3 36.0 - 46.0 %  ? MCV 86.3 80.0 - 100.0 fL  ? MCH 27.2 26.0 - 34.0 pg  ? MCHC 31.5 30.0 - 36.0 g/dL  ? RDW 14.5 11.5 - 15.5 %  ? Platelets 180 150 - 400 K/uL  ? nRBC 0.0 0.0 - 0.2 %  ? Neutrophils Relative % 88 %  ? Neutro Abs 7.7 1.7 - 7.7 K/uL  ? Lymphocytes Relative 6 %  ? Lymphs Abs 0.6 (L) 0.7 - 4.0 K/uL  ? Monocytes Relative 6 %  ? Monocytes Absolute 0.5 0.1 - 1.0 K/uL  ? Eosinophils Relative 0 %  ? Eosinophils Absolute 0.0 0.0 - 0.5 K/uL  ? Basophils Relative 0 %  ? Basophils Absolute 0.0 0.0 - 0.1 K/uL  ? Immature Granulocytes 0 %  ? Abs Immature Granulocytes 0.03 0.00 - 0.07 K/uL  ?Magnesium     Status: None  ? Collection Time: 11/04/21  6:24 AM  ?Result Value Ref Range  ? Magnesium 2.0 1.7 - 2.4 mg/dL  ?Glucose, capillary     Status: Abnormal  ? Collection Time: 11/04/21  8:41 AM  ?Result Value Ref Range  ? Glucose-Capillary 175 (H) 70 - 99 mg/dL  ?Glucose, capillary     Status: Abnormal  ? Collection Time: 11/04/21 12:18 PM  ?Result Value Ref Range  ?  Glucose-Capillary 155 (H) 70 - 99 mg/dL  ?Glucose, capillary     Status: Abnormal  ? Collection Time: 11/04/21  4:42 PM  ?Result Value Ref  Range  ? Glucose-Capillary 135 (H) 70 - 99 mg/dL  ?  ?I have Reviewed nursing notes, Vitals, and Lab results since pt's last encounter. Pertinent lab results : see above ?I have ordered test including BMP, CBC, Mg ?I have reviewed the last note from staff over past 24 hours ?I have discussed pt's care plan and test results with nursing staff, case manager ? ? LOS: 2 days  ? ?Dwyane Dee, MD ?[facility] ?11/04/2021, 6:18 PM ? ?

## 2021-11-04 NOTE — Op Note (Signed)
Patient Name: Isabella White ?DOB: 01-Feb-1946  ?MRN: [id number] ?  ?Date of Service: 11/02/2021 - 11/03/2021 ? ?Surgeon: Dr. Sharl Ma, DPM ?Assistants: None ?Pre-operative Diagnosis:  ?Right Achilles tendon rupture with calcaneal avulsion fracture ?Gastrocnemius equinus ?Haglund deformity ?Post-operative Diagnosis:  ?Right Achilles tendon rupture with calcaneal avulsion fracture ?Gastrocnemius equinus ?Haglund deformity ?Procedures: ? 1) secondary repair right Achilles tendon avulsion rupture ? 2) resection Haglund deformity right calcaneus ? 3) flexor hallucis longus tendon transfer ? 4) gastrocnemius recession ?Pathology/Specimens: ?ID Type Source Tests Collected by Time Destination  ?1 : RIGHT ACHILLES TENDON Tissue PATH Other SURGICAL PATHOLOGY Edwin Cap, DPM 11/03/2021 1414   ? ?Anesthesia: General anesthesia with preoperative regional block ?Hemostasis:  ?Total Tourniquet Time Documented: ?Thigh (Right) - 80 minutes ?Total: Thigh (Right) - 80 minutes ? ?Estimated Blood Loss: 1 mL ?Materials:  ?Implant Name Type Inv. Item Serial No. Manufacturer Lot No. LRB No. Used Action  ?ANCHOR SWIVELOCK BIO COMP - [id number] Anchor [place] [employer] BIO COMP  [employer] [id number] Right 1 Implanted  ?IMPLANT SYS BIOCOMP [facility] SPEED - [id number] Anchor IMPLANT SYS BIOCOMP ACH SPEED  [employer] [id number] Right 1 Implanted  ? ?Medications: None given ?Complications: No complication noted ? ?Intraoperative findings: Severe tendinosis of Achilles tendon with thinning of the tendon, osteoporotic bone ? ?Indications for Procedure:  ?This is a 76 y.o. female with a history of a fall from standing 1 day prior after her ankle "gave out".  Radiographs and MRI revealed a avulsion fracture of the calcaneus with retraction of the Achilles tendon.  Operative intervention was recommended. She had a strong palpable DP pulse.  Her PT pulse was nonpalpable secondary to edema, there is a monophasic Doppler signal available.  I discussed the  above findings with vascular surgery who will see the patient and likely proceed with angiography later this week.  Her skin had bruising along the posterior heel from the injury.  I recommended proceeding with operative intervention due to the fracture fragment causing blanching of the overlying skin. ?  ?Procedure in Detail: ?Patient was identified in pre-operative holding area. Formal consent was signed and the right lower extremity was marked. Patient was brought back to the operating room.   Anesthesia was induced.  She was placed into the prone position.  The extremity was prepped and draped in the usual sterile fashion. Timeout was taken to confirm patient name, laterality, and procedure prior to incision.  ? ?Attention was then directed to the posterior heel where a linear incision was made over the Achilles tendon.  Dissection was carried deep through subcutaneous tissue ensuring vital neurovascular structures were protected.  Care was taken with soft tissue handling of the skin flaps as well as minimizing soft tissue undermining.  The fracture fragment was readily identified and excised with a section of the Achilles tendon.  The appearance of the intrasubstance of the Achilles tendon appeared to be yellowed and degenerated and severely diseased.  I resected an amount of the diseased portions as proximal as possible while maintaining length of the Achilles tendon for reattachment.  Due to the severity of the tendinosis decided to proceed with FHL transfer to augment the repair.  Blunt dissection was used to expose the Kager's triangle and posterior musculature.  The FHL tendon was readily identified and retracted as far proximal as possible, blunt dissection was used to expose the fibro-osseous tunnel.  This was then cut distally as far as possible.  A fiber loop stitch was then used to  harness the FHL tendon stump.  It was sized and a 6 mm anchor was selected.  The guidewire for the anchor was placed into  the dorsal posterior calcaneus ensuring the distal exit did not violate the posterior tuberosity plantarly.  This was then reamed and the fiber loop passed through the guidewire and out the bottom of the foot as the FHL tendon was shuttled into the anchor tunnel.  Adequate tension was then placed on the foot and FHL tendon and the [employer] anchor was inserted.  Once this was completed I returned to the Achilles tendon.  Due to the amount of resection required of the diseased distal tendon, she required a gastrocnemius recession.  I carried the dissection proximally until the fibers of the gastrocnemius aponeurosis were separately identifiable from the underlying soleus.  Utilizing a scalpel a Strayer gastrocnemius recession was then performed and adequate length of the tendon was obtainable.  I then reattach the Achilles tendon utilizing the [employer] speed bridge system with 4 anchors in a 2 row construct with good strength stability and position of the tendon on the posterior superior calcaneus.  Once reconstruction was completed I irrigated thoroughly the entirety of the incision.  Complex layered closure was then completed utilizing 2-0 Vicryl, 3-0 and 4-0 Monocryl, 3-0 and 4-0 nylon. ? ?The foot was then dressed with a Prevena incisional wound VAC set to 75 mmHg.  The battery pack for the home system was not functioning properly and so the hose was attached to a inpatient unit VAC. Patient tolerated the procedure well. ?  ?Disposition: ?Following a period of post-operative monitoring, patient will be transferred to the floor.  I spoke with vascular surgery who will plan to take the patient to the operating room on Friday for angiography to evaluate her lower extremity vasculature to ensure she has adequate flow for wound healing. ? ?

## 2021-11-04 NOTE — TOC Progression Note (Signed)
Transition of Care (TOC) - Progression Note  ? ? ?Patient Details  ?Name: Isabella White ?MRN: [id number] ?Date of Birth: 05/21/1946 ? ?Transition of Care (TOC) CM/SW Contact  ?Marlowe Sax, RN ?Phone Number: ?11/04/2021, 3:37 PM ? ?Clinical Narrative:   Reviewed the bed offers with the patient, she would like to wait til morning to make a decision incase other offers are made, if no other offers come in she will choose [facility] based off of location, She will need Ins approval as well ? ? ? ?Expected Discharge Plan: Home w Home Health Services ?Barriers to Discharge: Continued Medical Work up ? ?Expected Discharge Plan and Services ?Expected Discharge Plan: Home w Home Health Services ?  ?Discharge Planning Services: CM Consult ?  ?Living arrangements for the past 2 months: Single Family Home ?                ?  ?  ?  ?  ?  ?  ?  ?  ?  ?  ? ? ?Social Determinants of Health (SDOH) Interventions ?  ? ?Readmission Risk Interventions ?No flowsheet data found. ? ?

## 2021-11-04 NOTE — H&P (View-Only) (Signed)
@LOGO @ ? ? ?MRN : [id number] ? ?Isabella White is a 76 y.o. (Aug 30, 1945) female who presents with chief complaint of broken heel. ? ?History of Present Illness:  ? ?I am asked to evaluate by Dr. Sherryle Lis. ? ?Patient is a 76 year old woman who presented to the emergency room here at [facility] 2 days ago with the chief complaint of right ankle pain status post a fall.  Work-up demonstrated an avulsion fracture of the calcaneus.  She was admitted and subsequently underwent repair November 03, 2021.  At the time of surgery Dr. Sherryle Lis noted bleeding from her tissues was poor and is concerned about occlusive disease and potential diminished arterial perfusion. ? ?The patient denies previous vascular evaluations or noninvasive studies.  She has not had any angiograms or catheterizations in the past.  She states it is difficult to describe pain with ambulation as claudication since she has had pain from her arthritis with walking for a long time.  She has not had any past open wounds sores or infections. ? ?Current Meds  ?Medication Sig  ? amLODipine (NORVASC) 5 MG tablet Take 5 mg by mouth daily.  ? budesonide-formoterol (SYMBICORT) 160-4.5 MCG/ACT inhaler Inhale 2 puffs into the lungs 2 (two) times daily.  ? ferrous sulfate 325 (65 FE) MG tablet Take 325 mg by mouth daily with breakfast.  ? hydrochlorothiazide (HYDRODIURIL) 25 MG tablet Take 25 mg by mouth daily.  ? insulin detemir (LEVEMIR FLEXTOUCH) 100 UNIT/ML FlexPen Inject 24 Units into the skin at bedtime.  ? losartan (COZAAR) 100 MG tablet Take 100 mg by mouth daily.  ? metFORMIN (GLUCOPHAGE-XR) 500 MG 24 hr tablet Take 1,000 mg by mouth at bedtime.  ? metoprolol succinate (TOPROL-XL) 50 MG 24 hr tablet Take 25-50 mg by mouth 2 (two) times daily.  ? montelukast (SINGULAIR) 10 MG tablet Take 10 mg by mouth at bedtime.  ? Multiple Vitamin (MULTIVITAMIN) tablet Take 1 tablet by mouth daily.  ? omeprazole (PRILOSEC) 20 MG capsule Take 20 mg by mouth 2 (two) times  daily as needed.  ? Semaglutide,0.25 or 0.5MG /DOS, (OZEMPIC, 0.25 OR 0.5 MG/DOSE,) 2 MG/1.5ML SOPN Inject 0.5 mg into the skin once a week. Sunday  ? sertraline (ZOLOFT) 100 MG tablet Take 100 mg by mouth daily.  ? vitamin B-12 (CYANOCOBALAMIN) 1000 MCG tablet Take 1,000 mcg by mouth daily.  ? ? ?Past Medical History:  ?Diagnosis Date  ? Anemia   ? Arthritis   ? Asthma   ? COPD (chronic obstructive pulmonary disease) ([place])   ? Cough   ? CHRONIC  ? Depression   ? Diabetes mellitus without complication ([place])   ? HAS GLUCOSE SENSOR LEFT SHOULDER  ? Dyspnea   ? GERD (gastroesophageal reflux disease)   ? Heart murmur   ? Hypertension   ? Sleep apnea   ? CPAP  ? Wheezing   ? ? ?Past Surgical History:  ?Procedure Laterality Date  ? ABDOMINAL HYSTERECTOMY    ? ACHILLES TENDON SURGERY Right 11/03/2021  ? Procedure: ACHILLES TENDON REPAIR;  Surgeon: Criselda Peaches, DPM;  Location: [facility] ORS;  Service: Podiatry;  Laterality: Right;  ? BREAST BIOPSY Right 1984  ? neg  ? CATARACT EXTRACTION W/PHACO Left 09/15/2016  ? Procedure: CATARACT EXTRACTION PHACO AND INTRAOCULAR LENS PLACEMENT (IOC);  Surgeon: Eulogio Bear, MD;  Location: [facility] ORS;  Service: Ophthalmology;  Laterality: Left;  Lot# [id number] ?[place]: 00:48.9 ?AP%: 15.1 ?CDE: 7.39  ? CATARACT EXTRACTION W/PHACO Right 11/10/2016  ? Procedure: CATARACT EXTRACTION  PHACO AND INTRAOCULAR LENS PLACEMENT (IOC);  Surgeon: Eulogio Bear, MD;  Location: [facility] ORS;  Service: Ophthalmology;  Laterality: Right;  [place] 56.6 ?AP% 10.8 ?CDE 6.39 ?Fluid Pack lot # [id number]  ? FRACTURE SURGERY    ? ORIF ANKLE  ? INCISION AND DRAINAGE Left 03/27/2017  ? Procedure: INCISION AND DRAINAGE;  Surgeon: Lovell Sheehan, MD;  Location: [facility] ORS;  Service: Orthopedics;  Laterality: Left;  ? ORIF WRIST FRACTURE Left 03/27/2017  ? Procedure: OPEN REDUCTION INTERNAL FIXATION (ORIF) WRIST FRACTURE ULNAR PINNING;  Surgeon: Lovell Sheehan, MD;  Location: [facility] ORS;  Service: Orthopedics;  Laterality: Left;   ? ? ?Social History ?Social History  ? ?Tobacco Use  ? Smoking status: Never  ? Smokeless tobacco: Never  ?Substance Use Topics  ? Alcohol use: No  ? ? ?Family History ?Family History  ?Problem Relation Age of Onset  ? Breast cancer Neg Hx   ? ? ?Allergies  ?Allergen Reactions  ? Atorvastatin   ?  Other reaction(s): Muscle Pain  ? Canagliflozin Diarrhea  ? Empagliflozin   ?  Other reaction(s): Abdominal Pain  ? Etodolac Other (See Comments)  ?  Intolerant, Jittery and Tachycardia  ? Lovastatin   ?  Other reaction(s): Unknown  ? Sulfa Antibiotics   ? Trazodone   ?  Other reaction(s): Other (See Comments) ?Worsened insomnia  ? Valium [Diazepam]   ? Penicillins Rash  ? ? ? ?REVIEW OF SYSTEMS (Negative unless checked) ? ?Constitutional: [] Weight loss  [] Fever  [] Chills ?Cardiac: [] Chest pain   [] Chest pressure   [] Palpitations   [] Shortness of breath when laying flat   [] Shortness of breath with exertion. ?Vascular:  [] Pain in legs with walking   [] Pain in legs at rest  [] History of DVT   [] Phlebitis   [] Swelling in legs   [] Varicose veins   [] Non-healing ulcers ?Pulmonary:   [] Uses home oxygen   [] Productive cough   [] Hemoptysis   [] Wheeze  [x] COPD   [] Asthma ?Neurologic:  [] Dizziness   [] Seizures   [] History of stroke   [] History of TIA  [] Aphasia   [] Vissual changes   [] Weakness or numbness in arm   [] Weakness or numbness in leg ?Musculoskeletal:   [] Joint swelling   [x] Joint pain   [] Low back pain ?Hematologic:  [] Easy bruising  [] Easy bleeding   [] Hypercoagulable state   [] Anemic ?Gastrointestinal:  [] Diarrhea   [] Vomiting  [x] Gastroesophageal reflux/heartburn   [] Difficulty swallowing. ?Genitourinary:  [] Chronic kidney disease   [] Difficult urination  [] Frequent urination   [] Blood in urine ?Skin:  [] Rashes   [] Ulcers  ?Psychological:  [] History of anxiety   [x]  History of depression. ? ?Physical Examination ? ?Vitals:  ? 11/04/21 0404 11/04/21 0955 11/04/21 1221 11/04/21 1521  ?BP: 130/65 (!) 124/58 (!) 131/54  (!) 128/53  ?Pulse: 81 91 88 86  ?Resp: 16 17 17 18   ?Temp: 97.7 ?F (36.5 ?C) 98.5 ?F (36.9 ?C) 98.1 ?F (36.7 ?C) 98.2 ?F (36.8 ?C)  ?TempSrc:  Oral Oral   ?SpO2: 97% 95% 100% 95%  ?Weight:      ?Height:      ? ?Body mass index is 35.52 kg/m?. ?Gen: WD/WN, NAD ?Head: [place]/AT, No temporalis wasting.  ?Ear/Nose/Throat: Hearing grossly intact, nares w/o erythema or drainage ?Eyes: PER, EOMI, sclera nonicteric.  ?Neck: Supple, no masses.  No bruit or JVD.  ?Pulmonary:  Good air movement, no audible wheezing, no use of accessory muscles.  ?Cardiac: RRR, normal S1, S2, no Murmurs. ?Vascular: Large bandage with splint  right foot ?Vessel Right Left  ?Radial Palpable Palpable  ?PT Bandage Palpable  ?DP Bandage Palpable  ?Gastrointestinal: soft, non-distended. No guarding/no peritoneal signs.  ?Musculoskeletal: M/S 5/5 throughout.  No visible deformity.  ?Neurologic: CN 2-12 intact. Pain and light touch intact in extremities.  Symmetrical.  Speech is fluent. Motor exam as listed above. ?Psychiatric: Judgment intact, Mood & affect appropriate for pt's clinical situation. ?Dermatologic: No rashes or ulcers noted.  No changes consistent with cellulitis. ? ? ?CBC ?Lab Results  ?Component Value Date  ? WBC 8.8 11/04/2021  ? HGB 12.7 11/04/2021  ? HCT 40.3 11/04/2021  ? MCV 86.3 11/04/2021  ? PLT 180 11/04/2021  ? ? ?BMET ?   ?Component Value Date/Time  ? NA 138 11/04/2021 0624  ? NA 140 09/03/2014 1433  ? K 4.2 11/04/2021 0624  ? K 4.3 09/03/2014 1433  ? CL 103 11/04/2021 0624  ? CL 104 09/03/2014 1433  ? CO2 23 11/04/2021 0624  ? CO2 27 09/03/2014 1433  ? GLUCOSE 132 (H) 11/04/2021 [id number]  ? GLUCOSE 200 (H) 09/03/2014 1433  ? BUN 14 11/04/2021 0624  ? BUN 19 (H) 09/03/2014 1433  ? CREATININE 0.96 11/04/2021 0624  ? CREATININE 0.98 09/03/2014 1433  ? CALCIUM 8.7 (L) 11/04/2021 [id number]  ? CALCIUM 8.9 09/03/2014 1433  ? GFRNONAA >60 11/04/2021 0624  ? GFRNONAA 60 (L) 09/03/2014 1433  ? GFRAA >60 03/26/2017 1719  ? GFRAA >60 09/03/2014 1433   ? ?Estimated Creatinine Clearance: 50.2 mL/min (by C-G formula based on SCr of 0.96 mg/dL). ? ?COAG ?Lab Results  ?Component Value Date  ? INR 0.99 03/26/2017  ? ? ?Radiology ?DG Ankle Complete Right ? ?Re

## 2021-11-04 NOTE — Consult Note (Signed)
@LOGO @   MRN : [id number]  Isabella White is a 76 y.o. (1945-09-24) female who presents with chief complaint of broken heel.  History of Present Illness:   I am asked to evaluate by Dr. Lilian Kapur.  Patient is a 76 year old woman who presented to the emergency room here at [facility] 2 days ago with the chief complaint of right ankle pain status post a fall.  Work-up demonstrated an avulsion fracture of the calcaneus.  She was admitted and subsequently underwent repair November 03, 2021.  At the time of surgery Dr. Lilian Kapur noted bleeding from her tissues was poor and is concerned about occlusive disease and potential diminished arterial perfusion.  The patient denies previous vascular evaluations or noninvasive studies.  She has not had any angiograms or catheterizations in the past.  She states it is difficult to describe pain with ambulation as claudication since she has had pain from her arthritis with walking for a long time.  She has not had any past open wounds sores or infections.  Current Meds  Medication Sig   amLODipine (NORVASC) 5 MG tablet Take 5 mg by mouth daily.   budesonide-formoterol (SYMBICORT) 160-4.5 MCG/ACT inhaler Inhale 2 puffs into the lungs 2 (two) times daily.   ferrous sulfate 325 (65 FE) MG tablet Take 325 mg by mouth daily with breakfast.   hydrochlorothiazide (HYDRODIURIL) 25 MG tablet Take 25 mg by mouth daily.   insulin detemir (LEVEMIR FLEXTOUCH) 100 UNIT/ML FlexPen Inject 24 Units into the skin at bedtime.   losartan (COZAAR) 100 MG tablet Take 100 mg by mouth daily.   metFORMIN (GLUCOPHAGE-XR) 500 MG 24 hr tablet Take 1,000 mg by mouth at bedtime.   metoprolol succinate (TOPROL-XL) 50 MG 24 hr tablet Take 25-50 mg by mouth 2 (two) times daily.   montelukast (SINGULAIR) 10 MG tablet Take 10 mg by mouth at bedtime.   Multiple Vitamin (MULTIVITAMIN) tablet Take 1 tablet by mouth daily.   omeprazole (PRILOSEC) 20 MG capsule Take 20 mg by mouth 2 (two) times  daily as needed.   Semaglutide,0.25 or 0.5MG /DOS, (OZEMPIC, 0.25 OR 0.5 MG/DOSE,) 2 MG/1.5ML SOPN Inject 0.5 mg into the skin once a week. Sunday   sertraline (ZOLOFT) 100 MG tablet Take 100 mg by mouth daily.   vitamin B-12 (CYANOCOBALAMIN) 1000 MCG tablet Take 1,000 mcg by mouth daily.    Past Medical History:  Diagnosis Date   Anemia    Arthritis    Asthma    COPD (chronic obstructive pulmonary disease) (HCC)    Cough    CHRONIC   Depression    Diabetes mellitus without complication (HCC)    HAS GLUCOSE SENSOR LEFT SHOULDER   Dyspnea    GERD (gastroesophageal reflux disease)    Heart murmur    Hypertension    Sleep apnea    CPAP   Wheezing     Past Surgical History:  Procedure Laterality Date   ABDOMINAL HYSTERECTOMY     ACHILLES TENDON SURGERY Right 11/03/2021   Procedure: ACHILLES TENDON REPAIR;  Surgeon: Edwin Cap, DPM;  Location: [facility] ORS;  Service: Podiatry;  Laterality: Right;   BREAST BIOPSY Right 1984   neg   CATARACT EXTRACTION W/PHACO Left 09/15/2016   Procedure: CATARACT EXTRACTION PHACO AND INTRAOCULAR LENS PLACEMENT (IOC);  Surgeon: Nevada Crane, MD;  Location: [facility] ORS;  Service: Ophthalmology;  Laterality: Left;  Lot# [id number] H [place]: 00:48.9 AP%: 15.1 CDE: 7.39   CATARACT EXTRACTION W/PHACO Right 11/10/2016   Procedure: CATARACT EXTRACTION  PHACO AND INTRAOCULAR LENS PLACEMENT (IOC);  Surgeon: Nevada Crane, MD;  Location: [facility] ORS;  Service: Ophthalmology;  Laterality: Right;  [place] 56.6 AP% 10.8 CDE 6.39 Fluid Pack lot # [id number]   FRACTURE SURGERY     ORIF ANKLE   INCISION AND DRAINAGE Left 03/27/2017   Procedure: INCISION AND DRAINAGE;  Surgeon: Lyndle Herrlich, MD;  Location: [facility] ORS;  Service: Orthopedics;  Laterality: Left;   ORIF WRIST FRACTURE Left 03/27/2017   Procedure: OPEN REDUCTION INTERNAL FIXATION (ORIF) WRIST FRACTURE ULNAR PINNING;  Surgeon: Lyndle Herrlich, MD;  Location: [facility] OR[facility];  Service: Orthopedics;  Laterality: Left;     Social History Social History   Tobacco Use   Smoking status: Never   Smokeless tobacco: Never  Substance Use Topics   Alcohol use: No    Family History Family History  Problem Relation Age of Onset   Breast cancer Neg Hx     Allergies  Allergen Reactions   Atorvastatin     Other reaction(s): Muscle Pain   Canagliflozin Diarrhea   Empagliflozin     Other reaction(s): Abdominal Pain   Etodolac Other (See Comments)    Intolerant, Jittery and Tachycardia   Lovastatin     Other reaction(s): Unknown   Sulfa Antibiotics    Trazodone     Other reaction(s): Other (See Comments) Worsened insomnia   Valium [Diazepam]    Penicillins Rash     REVIEW OF SYSTEMS (Negative unless checked)  Constitutional: [] Weight loss  [] Fever  [] Chills Cardiac: [] Chest pain   [] Chest pressure   [] Palpitations   [] Shortness of breath when laying flat   [] Shortness of breath with exertion. Vascular:  [] Pain in legs with walking   [] Pain in legs at rest  [] History of DVT   [] Phlebitis   [] Swelling in legs   [] Varicose veins   [] Non-healing ulcers Pulmonary:   [] Uses home oxygen   [] Productive cough   [] Hemoptysis   [] Wheeze  [x] COPD   [] Asthma Neurologic:  [] Dizziness   [] Seizures   [] History of stroke   [] History of TIA  [] Aphasia   [] Vissual changes   [] Weakness or numbness in arm   [] Weakness or numbness in leg Musculoskeletal:   [] Joint swelling   [x] Joint pain   [] Low back pain Hematologic:  [] Easy bruising  [] Easy bleeding   [] Hypercoagulable state   [] Anemic Gastrointestinal:  [] Diarrhea   [] Vomiting  [x] Gastroesophageal reflux/heartburn   [] Difficulty swallowing. Genitourinary:  [] Chronic kidney disease   [] Difficult urination  [] Frequent urination   [] Blood in urine Skin:  [] Rashes   [] Ulcers  Psychological:  [] History of anxiety   [x]  History of depression.  Physical Examination  Vitals:   11/04/21 0404 11/04/21 0955 11/04/21 1221 11/04/21 1521  BP: 130/65 (!) 124/58 (!) 131/54  (!) 128/53  Pulse: 81 91 88 86  Resp: 16 17 17 18   Temp: 97.7 F (36.5 C) 98.5 F (36.9 C) 98.1 F (36.7 C) 98.2 F (36.8 C)  TempSrc:  Oral Oral   SpO2: 97% 95% 100% 95%  Weight:      Height:       Body mass index is 35.52 kg/m. Gen: WD/WN, NAD Head: [place]/AT, No temporalis wasting.  Ear/Nose/Throat: Hearing grossly intact, nares w/o erythema or drainage Eyes: PER, EOMI, sclera nonicteric.  Neck: Supple, no masses.  No bruit or JVD.  Pulmonary:  Good air movement, no audible wheezing, no use of accessory muscles.  Cardiac: RRR, normal S1, S2, no Murmurs. Vascular: Large bandage with splint  right foot Vessel Right Left  Radial Palpable Palpable  PT Bandage Palpable  DP Bandage Palpable  Gastrointestinal: soft, non-distended. No guarding/no peritoneal signs.  Musculoskeletal: M/S 5/5 throughout.  No visible deformity.  Neurologic: CN 2-12 intact. Pain and light touch intact in extremities.  Symmetrical.  Speech is fluent. Motor exam as listed above. Psychiatric: Judgment intact, Mood & affect appropriate for pt's clinical situation. Dermatologic: No rashes or ulcers noted.  No changes consistent with cellulitis.   CBC Lab Results  Component Value Date   WBC 8.8 11/04/2021   HGB 12.7 11/04/2021   HCT 40.3 11/04/2021   MCV 86.3 11/04/2021   PLT 180 11/04/2021    BMET    Component Value Date/Time   NA 138 11/04/2021 0624   NA 140 09/03/2014 1433   K 4.2 11/04/2021 0624   K 4.3 09/03/2014 1433   CL 103 11/04/2021 0624   CL 104 09/03/2014 1433   CO2 23 11/04/2021 0624   CO2 27 09/03/2014 1433   GLUCOSE 132 (H) 11/04/2021 0624   GLUCOSE 200 (H) 09/03/2014 1433   BUN 14 11/04/2021 0624   BUN 19 (H) 09/03/2014 1433   CREATININE 0.96 11/04/2021 0624   CREATININE 0.98 09/03/2014 1433   CALCIUM 8.7 (L) 11/04/2021 0624   CALCIUM 8.9 09/03/2014 1433   GFRNONAA >60 11/04/2021 0624   GFRNONAA 60 (L) 09/03/2014 1433   GFRAA >60 03/26/2017 1719   GFRAA >60 09/03/2014 1433    Estimated Creatinine Clearance: 50.2 mL/min (by C-G formula based on SCr of 0.96 mg/dL).  COAG Lab Results  Component Value Date   INR 0.99 03/26/2017    Radiology DG Ankle Complete Right  Result Date: 11/02/2021 CLINICAL DATA:  Fall, twisted ankle EXAM: RIGHT ANKLE - COMPLETE 3+ VIEW COMPARISON:  None. FINDINGS: There is an avulsion fracture off the posterior calcaneus at the insertion of the Achilles tendon. Degenerative changes in the right ankle with spurring. No tibia or fibular fracture. Plantar calcaneal spur. IMPRESSION: Avulsion fracture of the posterior calcaneus at the Achilles tendon insertion. Large displaced bone fragment. Electronically Signed   By: Charlett Nose M.D.   On: 11/02/2021 17:07   MR HEEL RIGHT WO CONTRAST  Result Date: 11/03/2021 CLINICAL DATA:  Achilles tendon trauma or laceration. Mechanical fall avulsion fracture of the ankle EXAM: MR OF THE RIGHT HEEL WITHOUT CONTRAST TECHNIQUE: Multiplanar, multisequence MR imaging of the right heel was performed. No intravenous contrast was administered. COMPARISON:  Radiographs dated November 02, 2021 FINDINGS: TENDONS Achilles: There is avulsion fracture of the Achilles tendon with small osseous fragment attached to the distal Achilles. There is approximately 3.6 cm retraction of the tendon from its insertion. Peroneal: Peroneal longus tendon intact. Peroneal brevis intact. Posteromedial: Posterior tibial tendon intact. Flexor hallucis longus tendon intact. Flexor digitorum longus tendon intact. Anterior: Tibialis anterior tendon intact. Extensor hallucis longus tendon intact Extensor digitorum longus tendon intact. Plantar Fascia: Intact. LIGAMENTS Lateral: Anterior talofibular ligament intact. Calcaneofibular ligament intact. Posterior talofibular ligament intact. Anterior and posterior tibiofibular ligaments intact. Medial: Deltoid ligament intact. Spring ligament intact. CARTILAGE Ankle Joint: Small joint effusion. Joint space  narrowing with subchondral cystic changes of the distal tibia. There also cystic changes of the medial aspect of the talar dome. Subtalar Joints/Sinus Tarsi: Normal subtalar joints. No subtalar joint effusion. Normal sinus tarsi. Bones: There is bone marrow edema of the medial aspect of the talus, calcaneal body, cuboid bone and intermediate cuneiform bones consistent with bone contusion. Soft Tissue: Subcutaneous soft tissue edema  about the ankle without evidence of drainable fluid collection prominent about the lateral aspect of the ankle. IMPRESSION: 1. Avulsion fracture of the Achilles with small osseous fragment attached to the distal Achilles and approximately 3.6 cm retraction of the tendon from its insertion. 2. Bone marrow edema of the talus, calcaneal body, cuboid and intermediate cuneiform consistent with bone contusion without evidence of displaced fracture. 3. Moderate osteoarthritis characterized by subchondral cystic changes of the tibial plafond and medial aspect of the talar dome. 4. No evidence of ligament injury. The tendons of the flexor, extensor and peroneal car compartment appear intact. Electronically Signed   By: Larose Hires D.O.   On: 11/03/2021 07:02   DG Chest Port 1 View  Result Date: 11/02/2021 CLINICAL DATA:  Preoperative assessment. Avulsion fracture of right calcaneus. History of asthma and COPD. EXAM: PORTABLE CHEST 1 VIEW COMPARISON:  Chest and rib radiographs 03/26/2017 FINDINGS: Cardiac silhouette is again at the upper limits of normal size. Mild calcification is again seen within aortic arch. Mild bilateral chronic interstitial thickening is unchanged. No new focal airspace opacity. No pleural effusion or pneumothorax. Mild dextrocurvature of the midthoracic spine with moderate multilevel degenerative disc changes. Moderate right subacromial spurring. IMPRESSION:: IMPRESSION: Mild chronic interstitial thickening.  No acute lung process. Electronically Signed   By: Neita Garnet M.D.   On: 11/02/2021 19:40   [place] OR NERVE BLOCK-IMAGE ONLY [facility])  Result Date: 11/03/2021 There is no interpretation for this exam.  This order is for images obtained during a surgical procedure.  Please See "Surgeries" Tab for more information regarding the procedure.   DG MINI C-ARM IMAGE ONLY  Result Date: 11/03/2021 There is no interpretation for this exam.  This order is for images obtained during a surgical procedure.  Please See "Surgeries" Tab for more information regarding the procedure.     Assessment/Plan 1.  PAD: Given the findings at operation as well as the Achilles tendon repair optimal arterial perfusion is a necessity.  Under the present circumstances angiography with the potential for immediate intervention is a very reasonable request.  This was discussed with the patient and she concurs.  Tomorrow we will plan for right lower extremity angiography with possible intervention.  Risk and benefits of been reviewed all questions been answered patient has agreed to proceed.  2.  Ankle fracture: Patient is status post repair further plan per podiatry foot and ankle service.  3.  Diabetes mellitus: Continue hypoglycemic medications as already ordered, these medications have been reviewed and there are no changes at this time.  Hgb A1C to be monitored as already arranged by primary service.  4.  Hypertension: Continue antihypertensive medications as already ordered, these medications have been reviewed and there are no changes at this time.    Levora Dredge, MD  11/04/2021 7:35 PM

## 2021-11-04 NOTE — Evaluation (Signed)
Occupational Therapy Evaluation ?Patient Details ?Name: Isabella White ?MRN: [id number] ?DOB: 08-08-1946 ?Today's Date: 11/04/2021 ? ? ?History of Present Illness Isabella White is a pleasant 76 y.o. female with medical history significant for hypertension, insulin-dependent diabetes mellitus, asthma, OSA, depression, and anxiety, now presenting to the emergency department with right ankle pain.  Patient has been seen by podiatry recently for right foot pain and has been using a cane due to this, but had otherwise been in her usual state of health when she had her right ankle "give out" today, leading to a fall and acute right ankle pain.  She denies hitting her head or suffering any other injury.  She denies any recent illness, is fully independent, and never experiences angina. Patient is s/p repair of calcaneal avulsion fx and achillies tendon rupture. NWB R LE  ? ?Clinical Impression ?  ?Patient presenting with decreased Ind in self care, balance, functional mobility/transfers, endurance, and safety awareness. Patient reports living at home alone and being independent in all aspects of care and mobility. Patient is able to maintain NWB precaution throughout session but needing mod - max A from standard height surfaces with use of RW. Pt unable to hope for mobility and really only able to stand pivot L <> R. [facility] but needing assistance for hygiene and clothing management. Patient will benefit from acute OT to increase overall independence in the areas of ADLs, functional mobility, and safety awareness in order to safely discharge to next venue of care.  ?   ? ?Recommendations for follow up therapy are one component of a multi-disciplinary discharge planning process, led by the attending physician.  Recommendations may be updated based on patient status, additional functional criteria and insurance authorization.  ? ?Follow Up Recommendations ? Skilled nursing-short term rehab (<3 hours/day)  ?   ?Assistance Recommended at Discharge Frequent or constant Supervision/Assistance  ?Patient can return home with the following A lot of help with walking and/or transfers;A lot of help with bathing/dressing/bathroom;Help with stairs or ramp for entrance;Assist for transportation ? ?  ?Functional Status Assessment ? Patient has had a recent decline in their functional status and demonstrates the ability to make significant improvements in function in a reasonable and predictable amount of time.  ?Equipment Recommendations ? Other (comment) (defer to next venue of care)  ?  ?   ?Precautions / Restrictions Precautions ?Precautions: Fall ?Restrictions ?Weight Bearing Restrictions: Yes ?RLE Weight Bearing: Non weight bearing  ? ?  ? ?Mobility Bed Mobility ?Overal bed mobility: Needs Assistance ?Bed Mobility: Sit to Supine ?  ?  ?  ?Sit to supine: Mod assist ?  ?  ?  ? ?Transfers ?Overall transfer level: Needs assistance ?Equipment used: Rolling walker (2 wheels) ?Transfers: Sit to/from Stand, Bed to chair/wheelchair/BSC ?Sit to Stand: Max assist, Mod assist ?Stand pivot transfers: Min assist, Mod assist ?  ?  ?  ?  ?General transfer comment: Patient requires cues for hand placement with sit to stand. Min- mod  assist to advance RW during pivot. ?  ? ?  ?Balance Overall balance assessment: Needs assistance ?Sitting-balance support: Feet unsupported, Bilateral upper extremity supported ?Sitting balance-Leahy Scale: Good ?Sitting balance - Comments: seated on BSC ?  ?Standing balance support: Bilateral upper extremity supported, During functional activity, Reliant on assistive device for balance ?Standing balance-Leahy Scale: Poor ?Standing balance comment: assist for balance ?  ?  ?  ?  ?  ?  ?  ?  ?  ?  ?  ?   ? ?  ADL either performed or assessed with clinical judgement  ? ?ADL Overall ADL's : Needs assistance/impaired ?  ?  ?  ?  ?  ?  ?  ?  ?  ?  ?  ?  ?Toilet Transfer: Moderate assistance;BSC/3in1;Rolling walker (2  wheels);Stand-pivot ?  ?Toileting- Clothing Manipulation and Hygiene: Maximal assistance;Sit to/from stand ?  ?  ?  ?Functional mobility during ADLs: Moderate assistance;Rolling walker (2 wheels) ?   ? ? ? ?Vision Patient Visual Report: No change from baseline ?   ?   ?   ?   ? ?Pertinent Vitals/Pain Pain Assessment ?Pain Assessment: Faces ?Faces Pain Scale: Hurts little more ?Pain Location: R LE ?Pain Descriptors / Indicators: Discomfort, Throbbing ?Pain Intervention(s): Monitored during session, Repositioned, Limited activity within patient's tolerance  ? ? ? ?Hand Dominance Right ?  ?Extremity/Trunk Assessment Upper Extremity Assessment ?Upper Extremity Assessment: Generalized weakness ?  ?Lower Extremity Assessment ?Lower Extremity Assessment: Defer to PT evaluation ?RLE Coordination: decreased gross motor ?  ?Cervical / Trunk Assessment ?Cervical / Trunk Assessment: Normal ?  ?Communication Communication ?Communication: No difficulties ?  ?Cognition Arousal/Alertness: Awake/alert ?Behavior During Therapy: [facility] for tasks assessed/performed, Anxious ?Overall Cognitive Status: Within Functional Limits for tasks assessed ?  ?  ?  ?  ?  ?  ?  ?  ?  ?  ?  ?  ?  ?  ?  ?  ?  ?  ?  ?   ?   ?   ? ? ?Home Living Family/patient expects to be discharged to:: Skilled nursing facility ?Living Arrangements: Alone ?  ?  ?  ?  ?  ?  ?  ?  ?  ?  ?  ?  ?  ?  ?  ?  ?  ? ?  ?Prior Functioning/Environment Prior Level of Function : Independent/Modified Independent ?  ?  ?  ?  ?  ?  ?Mobility Comments: used cane prior to admssion ?ADLs Comments: independent ?  ? ?  ?  ?OT Problem List: Decreased strength;Pain;Decreased activity tolerance;Decreased safety awareness;Impaired balance (sitting and/or standing);Decreased knowledge of use of DME or AE;Decreased knowledge of precautions ?  ?   ?OT Treatment/Interventions: Self-care/ADL training;Therapeutic exercise;Therapeutic activities;Manual therapy;Balance training;Patient/family  education;DME and/or AE instruction;Energy conservation  ?  ?OT Goals(Current goals can be found in the care plan section) Acute Rehab OT Goals ?Patient Stated Goal: to get better and go home ?OT Goal Formulation: With patient ?Time For Goal Achievement: 11/18/21 ?Potential to Achieve Goals: Good ?ADL Goals ?Pt Will Perform Grooming: with supervision ?Pt Will Perform Lower Body Dressing: sitting/lateral leans;with min assist ?Pt Will Transfer to Toilet: with min assist;stand pivot transfer;bedside commode ?Pt Will Perform Toileting - Clothing Manipulation and hygiene: with min assist;sit to/from stand  ?OT Frequency: Min 2X/week ?  ? ?   ?AM-PAC OT "6 Clicks" Daily Activity     ?Outcome Measure Help from another person eating meals?: None ?Help from another person taking care of personal grooming?: A Little ?Help from another person toileting, which includes using toliet, bedpan, or urinal?: A Lot ?Help from another person bathing (including washing, rinsing, drying)?: A Lot ?Help from another person to put on and taking off regular upper body clothing?: A Little ?Help from another person to put on and taking off regular lower body clothing?: A Lot ?6 Click Score: 16 ?  ?End of Session Equipment Utilized During Treatment: Rolling walker (2 wheels) ?Nurse Communication: Mobility status ? ?Activity Tolerance: Patient limited  by fatigue ?Patient left: in bed;with call bell/phone within reach;with bed alarm set ? ?OT Visit Diagnosis: Unsteadiness on feet (R26.81);Muscle weakness (generalized) (M62.81);Pain ?Pain - Right/Left: Right ?Pain - part of body: Ankle and joints of foot  ?              ?Time: 3664-4034 ?OT Time Calculation (min): 23 min ?Charges:  OT General Charges ?$OT Visit: 1 Visit ?OT Evaluation ?$OT Eval Moderate Complexity: 1 Mod ?OT Treatments ?$Self Care/Home Management : 8-22 mins ? ?Jackquline Denmark, MS, OTR/L , CBIS ?ascom [phone]  ?11/04/21, 2:51 PM  ?

## 2021-11-04 NOTE — Plan of Care (Signed)

## 2021-11-04 NOTE — Plan of Care (Signed)
Patient sleeping between care. Aox4. Reports minimal pain. Wound vac output just below 70ml. Plan of care reviewed with patient. Call bell within reach. ? ? ?Problem: Health Behavior/Discharge Planning: ?Goal: Ability to manage health-related needs will improve ?Outcome: Progressing ?  ?Problem: Clinical Measurements: ?Goal: Ability to maintain clinical measurements within normal limits will improve ?Outcome: Progressing ?Goal: Will remain free from infection ?Outcome: Progressing ?Goal: Diagnostic test results will improve ?Outcome: Progressing ?Goal: Respiratory complications will improve ?Outcome: Progressing ?Goal: Cardiovascular complication will be avoided ?Outcome: Progressing ?  ?Problem: Activity: ?Goal: Risk for activity intolerance will decrease ?Outcome: Progressing ?  ?Problem: Nutrition: ?Goal: Adequate nutrition will be maintained ?Outcome: Progressing ?  ?Problem: Coping: ?Goal: Level of anxiety will decrease ?Outcome: Progressing ?  ?Problem: Pain Managment: ?Goal: General experience of comfort will improve ?Outcome: Progressing ?  ?Problem: Safety: ?Goal: Ability to remain free from injury will improve ?Outcome: Progressing ?  ?Problem: Skin Integrity: ?Goal: Risk for impaired skin integrity will decrease ?Outcome: Progressing ?  ?

## 2021-11-04 NOTE — Progress Notes (Signed)
?  Subjective:  ?Patient ID: Isabella White, female    DOB: September 01, 1945,  MRN: [id number] ? ?A30 y.o.  female presents with calcaneal fracture secondary avulsion fracture of the Achilles status post resection of Haglund's deformity with Achilles tendon repair with FHL tendon transfer with gastrocnemius recession.  She states she is doing well but she is having some pain and discomfort to the right side.  She has been nonweightbearing to the right lower extremity.  Prevena VAC is functioning correctly. ?Objective:  ? ?Vitals:  ? 11/04/21 0404 11/04/21 0955  ?BP: 130/65 (!) 124/58  ?Pulse: 81 91  ?Resp: 16 17  ?Temp: 97.7 ?F (36.5 ?C) 98.5 ?F (36.9 ?C)  ?SpO2: 97% 95%  ? ?General AA&O x3. Normal mood and affect.  ?Vascular Dorsalis pedis and posterior tibial pulses nonpalpabe bilat. ?Brisk capillary diminished to all digits. Pedal hair not present.  ?Neurologic Epicritic sensation grossly intact.  ?Dermatologic Dressing clean dry and intact.  No calf pain.  Motor or sensory functions are intact.  ?Orthopedic: MMT 5/5 in dorsiflexion, plantarflexion, inversion, and eversion. ?Normal joint ROM without pain or crepitus.  ? ? ?Assessment & Plan:  ?Patient was evaluated and treated and all questions answered. ? ?Right Achilles tendon rupture with calcaneal avulsion fracture/ankle equinus/Haglund's deformity status post Achilles tendon repair with resection of the Haglund's with FHL transfer and gastrocnemius recession ?-All questions and concerns were discussed with the patient in extensive detail ?-Continue nonweightbearing to the right lower extremity. ?-She is okay to be discharged from podiatric standpoint.  We will need to transfer her to home [facility] when she is ready to go.  Nursing can help with this. ?-She will follow-up with Dr. Lilian Kapur 1 week from discharge ?-She is also scheduled to undergo angiography with vascular surgery on Friday. ? ?Candelaria Stagers, DPM ? ?Accessible via secure chat for questions or  concerns. ? ?

## 2021-11-04 NOTE — [place] FL2 (Signed)
?[place] MEDICAID FL2 LEVEL OF CARE SCREENING TOOL  ?  ? ?IDENTIFICATION  ?Patient Name: ?Isabella White Birthdate: 11/07/1945 Sex: female Admission Date (Current Location): ?11/02/2021  ?[place] Number: ? [place] ?  Facility and Address:  ?[facility], [address], [place], [place] [address] ?     Provider Number: ?[id number]  ?Attending Physician Name and Address:  ?Lewie Chamber, MD ? Relative Name and Phone Number:  ?Joni Reining Niece [phone] ?   ?Current Level of Care: ?Hospital Recommended Level of Care: ?Skilled Nursing Facility Prior Approval Number: ?  ? ?Date Approved/Denied: ?  PASRR Number: ?[id number] A ? ?Discharge Plan: ?SNF ?  ? ?Current Diagnoses: ?Patient Active Problem List  ? Diagnosis Date Noted  ? Achilles tendinosis of right lower extremity   ? Chronic rupture of Achilles tendon   ? Haglund's deformity of right heel   ? Gastrocnemius equinus, right   ? Avulsion fracture of right calcaneus 11/02/2021  ? Diabetes mellitus without complication (HCC)   ? Sleep apnea   ? COPD (chronic obstructive pulmonary disease) (HCC)   ? Hypertension   ? Depression   ? ? ?Orientation RESPIRATION BLADDER Height & Weight   ?  ?Self, Time, Situation, Place ?   Continent Weight: 85.3 kg ?Height:  5\' 1"  (154.9 cm)  ?BEHAVIORAL SYMPTOMS/MOOD NEUROLOGICAL BOWEL NUTRITION STATUS  ?    Continent Diet (see dc summary)  ?AMBULATORY STATUS COMMUNICATION OF NEEDS Skin   ?Extensive Assist Verbally Normal, Surgical wounds ?  ?  ?  ?    ?     ?     ? ? ?Personal Care Assistance Level of Assistance  ?Bathing, Feeding, Dressing Bathing Assistance: Limited assistance ?Feeding assistance: Independent ?Dressing Assistance: Limited assistance ?   ? ?Functional Limitations Info  ?    ?  ?   ? ? ?SPECIAL CARE FACTORS FREQUENCY  ?PT (By licensed PT), OT (By licensed OT)   ?  ?PT Frequency: 5 times per week ?OT Frequency: 5 times per week ?  ?  ?  ?   ? ? ?Contractures Contractures Info: Not  present  ? ? ?Additional Factors Info  ?Code Status, Allergies Code Status Info: full code ?Allergies Info: Atorvastatin, Canagliflozin, Empagliflozin, Etodolac, Lovastatin, Sulfa Antibiotics, Trazodone, Valium (Diazepam), Penicillins ?  ?  ?  ?   ? ?Current Medications (11/04/2021):  This is the current hospital active medication list ?Current Facility-Administered Medications  ?Medication Dose Route Frequency Provider Last Rate Last Admin  ? acetaminophen (TYLENOL) tablet 650 mg  650 mg Oral Q6H PRN 11/06/2021, DPM   650 mg at 11/04/21 11/06/21  ? Or  ? acetaminophen (TYLENOL) suppository 650 mg  650 mg Rectal Q6H PRN McDonald, 2725, DPM      ? albuterol (VENTOLIN HFA) 108 (90 Base) MCG/ACT inhaler 2 puff  2 puff Inhalation Q6H PRN McDonald, Rachelle Hora, DPM      ? amLODipine (NORVASC) tablet 5 mg  5 mg Oral Daily McDonald, Adam R, DPM      ? fentaNYL (SUBLIMAZE) injection 25-50 mcg  25-50 mcg Intravenous Q2H PRN 08-20-2005, DPM      ? insulin aspart (novoLOG) injection 0-6 Units  0-6 Units Subcutaneous Q4H Edwin Cap, DPM   1 Units at 11/04/21 11/06/21  ? insulin detemir (LEVEMIR) injection 10 Units  10 Units Subcutaneous QHS 3664, DPM   10 Units at 11/03/21 2110  ? mometasone-formoterol (DULERA) 200-5 MCG/ACT inhaler 2  puff  2 puff Inhalation BID Edwin Cap, DPM   2 puff at 11/04/21 0850  ? montelukast (SINGULAIR) tablet 10 mg  10 mg Oral QHS Edwin Cap, DPM   10 mg at 11/03/21 2109  ? ondansetron (ZOFRAN) tablet 4 mg  4 mg Oral Q6H PRN Edwin Cap, DPM      ? Or  ? ondansetron (ZOFRAN) injection 4 mg  4 mg Intravenous Q6H PRN Edwin Cap, DPM   4 mg at 11/03/21 1353  ? oxyCODONE (Oxy IR/ROXICODONE) immediate release tablet 5 mg  5 mg Oral Q4H PRN Edwin Cap, DPM   5 mg at 11/03/21 0910  ? pantoprazole (PROTONIX) EC tablet 40 mg  40 mg Oral Daily Sharl Ma R, DPM   40 mg at 11/04/21 1030  ? senna-docusate (Senokot-S) tablet 1 tablet  1 tablet Oral QHS PRN  Edwin Cap, DPM      ? sertraline (ZOLOFT) tablet 50 mg  50 mg Oral QHS Edwin Cap, DPM   50 mg at 11/03/21 2109  ? ? ? ?Discharge Medications: ?Please see discharge summary for a list of discharge medications. ? ?Relevant Imaging Results: ? ?Relevant Lab Results: ? ? ?Additional Information ?SS# [id number] ? ?Marlowe Sax, RN ? ? ? ? ?

## 2021-11-04 NOTE — Progress Notes (Signed)
The patient is unable to stand from a standard toilet seat height without the assistance of the 3 in 1 to facilitate being able to use their hands to assist in standing. ?

## 2021-11-04 NOTE — TOC Progression Note (Signed)
Transition of Care (TOC) - Progression Note  ? ? ?Patient Details  ?Name: Isabella White ?MRN: [id number] ?Date of Birth: 12/03/1945 ? ?Transition of Care (TOC) CM/SW Contact  ?Conception Oms, RN ?Phone Number: ?11/04/2021, 12:21 PM ? ?Clinical Narrative:   SNF recommended by PT, The patient is agreeable, Bedsearch sent, FL2 completed, PASSr obtained, will review bed offers once obtained ? ? ? ?Expected Discharge Plan: [place] ?Barriers to Discharge: Continued Medical Work up ? ?Expected Discharge Plan and Services ?Expected Discharge Plan: [place] ?  ?Discharge Planning Services: CM Consult ?  ?Living arrangements for the past 2 months: [place] ?                ?  ?  ?  ?  ?  ?  ?  ?  ?  ?  ? ? ?Social Determinants of Health (SDOH) Interventions ?  ? ?Readmission Risk Interventions ?No flowsheet data found. ? ?

## 2021-11-05 ENCOUNTER — Encounter: Payer: Self-pay | Admitting: Vascular Surgery

## 2021-11-05 ENCOUNTER — Encounter
Admission: EM | Disposition: A | Discharge status: Home Health | Payer: Self-pay | Source: Home / Self Care | Attending: Internal Medicine

## 2021-11-05 DIAGNOSIS — S92031A Displaced avulsion fracture of tuberosity of right calcaneus, initial encounter for closed fracture: Secondary | ICD-10-CM | POA: Diagnosis not present

## 2021-11-05 DIAGNOSIS — Z531 Procedure and treatment not carried out because of patient's decision for reasons of belief and group pressure: Secondary | ICD-10-CM

## 2021-11-05 DIAGNOSIS — I70201 Unspecified atherosclerosis of native arteries of extremities, right leg: Secondary | ICD-10-CM

## 2021-11-05 DIAGNOSIS — Z01810 Encounter for preprocedural cardiovascular examination: Secondary | ICD-10-CM

## 2021-11-05 DIAGNOSIS — S86091A Other specified injury of right Achilles tendon, initial encounter: Secondary | ICD-10-CM

## 2021-11-05 HISTORY — PX: LOWER EXTREMITY ANGIOGRAPHY: CATH118251

## 2021-11-05 LAB — GLUCOSE, CAPILLARY
Glucose-Capillary: 136 mg/dL — ABNORMAL HIGH (ref 70–99)
Glucose-Capillary: 145 mg/dL — ABNORMAL HIGH (ref 70–99)
Glucose-Capillary: 146 mg/dL — ABNORMAL HIGH (ref 70–99)
Glucose-Capillary: 153 mg/dL — ABNORMAL HIGH (ref 70–99)
Glucose-Capillary: 155 mg/dL — ABNORMAL HIGH (ref 70–99)
Glucose-Capillary: 156 mg/dL — ABNORMAL HIGH (ref 70–99)
Glucose-Capillary: 205 mg/dL — ABNORMAL HIGH (ref 70–99)
Glucose-Capillary: 234 mg/dL — ABNORMAL HIGH (ref 70–99)

## 2021-11-05 LAB — BASIC METABOLIC PANEL
Anion gap: 8 (ref 5–15)
BUN: 20 mg/dL (ref 8–23)
CO2: 27 mmol/L (ref 22–32)
Calcium: 8.6 mg/dL — ABNORMAL LOW (ref 8.9–10.3)
Chloride: 105 mmol/L (ref 98–111)
Creatinine, Ser: 0.97 mg/dL (ref 0.44–1.00)
GFR, Estimated: >60 mL/min (ref >60)
Glucose, Bld: 153 mg/dL — ABNORMAL HIGH (ref 70–99)
Potassium: 4.6 mmol/L (ref 3.5–5.1)
Sodium: 140 mmol/L (ref 135–145)

## 2021-11-05 LAB — CBC WITH DIFFERENTIAL/PLATELET
Abs Immature Granulocytes: 0.03 10*3/uL (ref 0.00–0.07)
Basophils Absolute: 0 10*3/uL (ref 0.0–0.1)
Basophils Relative: 1 %
Eosinophils Absolute: 0.1 10*3/uL (ref 0.0–0.5)
Eosinophils Relative: 1 %
HCT: 36.1 % (ref 36.0–46.0)
Hemoglobin: 11.2 g/dL — ABNORMAL LOW (ref 12.0–15.0)
Immature Granulocytes: 1 %
Lymphocytes Relative: 20 %
Lymphs Abs: 1.2 10*3/uL (ref 0.7–4.0)
MCH: 27.6 pg (ref 26.0–34.0)
MCHC: 31 g/dL (ref 30.0–36.0)
MCV: 88.9 fL (ref 80.0–100.0)
Monocytes Absolute: 0.5 10*3/uL (ref 0.1–1.0)
Monocytes Relative: 8 %
Neutro Abs: 4.3 10*3/uL (ref 1.7–7.7)
Neutrophils Relative %: 69 %
Platelets: 145 10*3/uL — ABNORMAL LOW (ref 150–400)
RBC: 4.06 MIL/uL (ref 3.87–5.11)
RDW: 15 % (ref 11.5–15.5)
WBC: 6.1 10*3/uL (ref 4.0–10.5)
nRBC: 0 % (ref 0.0–0.2)

## 2021-11-05 LAB — MAGNESIUM: Magnesium: 2.3 mg/dL (ref 1.7–2.4)

## 2021-11-05 LAB — SURGICAL PATHOLOGY

## 2021-11-05 SURGERY — LOWER EXTREMITY ANGIOGRAPHY
Anesthesia: Moderate Sedation | Laterality: Right

## 2021-11-05 MED ORDER — MORPHINE SULFATE (PF) 2 MG/ML IV SOLN: 2.0000 mg | INTRAVENOUS | Status: DC | PRN

## 2021-11-05 MED ORDER — ACETAMINOPHEN 325 MG PO TABS
650.0000 mg | ORAL_TABLET | ORAL | Status: DC | PRN
  Administered 2021-11-05 – 2021-11-08 (×9): 650 mg via ORAL
  Filled 2021-11-05 (×9): qty 2

## 2021-11-05 MED ORDER — SODIUM CHLORIDE 0.9 % IV SOLN: INTRAVENOUS | Status: AC

## 2021-11-05 MED ORDER — MIDAZOLAM HCL 5 MG/5ML IJ SOLN
INTRAMUSCULAR | Status: AC
  Filled 2021-11-05: qty 5

## 2021-11-05 MED ORDER — IODIXANOL 320 MG/ML IV SOLN
INTRAVENOUS | Status: DC | PRN
  Administered 2021-11-05: 40 mL via INTRA_ARTERIAL

## 2021-11-05 MED ORDER — MIDAZOLAM HCL 2 MG/ML PO SYRP: 8.0000 mg | ORAL_SOLUTION | Freq: Once | ORAL | Status: DC | PRN

## 2021-11-05 MED ORDER — HEPARIN SODIUM (PORCINE) 1000 UNIT/ML IJ SOLN
INTRAMUSCULAR | Status: AC
  Filled 2021-11-05: qty 10

## 2021-11-05 MED ORDER — OXYCODONE HCL 5 MG PO TABS: 5.0000 mg | ORAL_TABLET | ORAL | Status: DC | PRN

## 2021-11-05 MED ORDER — SODIUM CHLORIDE 0.9% FLUSH
3.0000 mL | Freq: Two times a day (BID) | INTRAVENOUS | Status: DC
  Administered 2021-11-05 – 2021-11-08 (×5): 3 mL via INTRAVENOUS

## 2021-11-05 MED ORDER — MIDAZOLAM HCL 2 MG/2ML IJ SOLN
INTRAMUSCULAR | Status: DC | PRN
  Administered 2021-11-05: 2 mg via INTRAVENOUS

## 2021-11-05 MED ORDER — FAMOTIDINE 20 MG PO TABS: 40.0000 mg | ORAL_TABLET | Freq: Once | ORAL | Status: DC | PRN

## 2021-11-05 MED ORDER — FENTANYL CITRATE (PF) 100 MCG/2ML IJ SOLN
INTRAMUSCULAR | Status: DC | PRN
  Administered 2021-11-05: 50 ug via INTRAVENOUS

## 2021-11-05 MED ORDER — MUPIROCIN 2 % EX OINT
TOPICAL_OINTMENT | Freq: Two times a day (BID) | CUTANEOUS | Status: DC
  Filled 2021-11-05: qty 22

## 2021-11-05 MED ORDER — HYDROMORPHONE HCL 1 MG/ML IJ SOLN: 1.0000 mg | Freq: Once | INTRAMUSCULAR | Status: DC | PRN

## 2021-11-05 MED ORDER — CLINDAMYCIN PHOSPHATE 300 MG/50ML IV SOLN
INTRAVENOUS | Status: AC
  Administered 2021-11-05: 300 mg via INTRAVENOUS
  Filled 2021-11-05: qty 50

## 2021-11-05 MED ORDER — SODIUM CHLORIDE 0.9 % IV SOLN: INTRAVENOUS | Status: DC

## 2021-11-05 MED ORDER — ONDANSETRON HCL 4 MG/2ML IJ SOLN: 4.0000 mg | Freq: Four times a day (QID) | INTRAMUSCULAR | Status: DC | PRN

## 2021-11-05 MED ORDER — SODIUM CHLORIDE 0.9% FLUSH: 3.0000 mL | INTRAVENOUS | Status: DC | PRN

## 2021-11-05 MED ORDER — LOSARTAN POTASSIUM 50 MG PO TABS
100.0000 mg | ORAL_TABLET | Freq: Every day | ORAL | Status: DC
  Administered 2021-11-05 – 2021-11-08 (×4): 100 mg via ORAL
  Filled 2021-11-05 (×4): qty 2

## 2021-11-05 MED ORDER — METHYLPREDNISOLONE SODIUM SUCC 125 MG IJ SOLR: 125.0000 mg | Freq: Once | INTRAMUSCULAR | Status: DC | PRN

## 2021-11-05 MED ORDER — FENTANYL CITRATE PF 50 MCG/ML IJ SOSY
PREFILLED_SYRINGE | INTRAMUSCULAR | Status: AC
  Filled 2021-11-05: qty 2

## 2021-11-05 MED ORDER — DIPHENHYDRAMINE HCL 50 MG/ML IJ SOLN: 50.0000 mg | Freq: Once | INTRAMUSCULAR | Status: DC | PRN

## 2021-11-05 MED ORDER — SODIUM CHLORIDE 0.9 % IV SOLN: 250.0000 mL | INTRAVENOUS | Status: DC | PRN

## 2021-11-05 MED ORDER — CLINDAMYCIN PHOSPHATE 300 MG/50ML IV SOLN
300.0000 mg | Freq: Once | INTRAVENOUS | Status: AC
Start: 2021-11-05 — End: 2021-11-06

## 2021-11-05 MED ORDER — SERTRALINE HCL 50 MG PO TABS
100.0000 mg | ORAL_TABLET | Freq: Every day | ORAL | Status: DC
  Administered 2021-11-05 – 2021-11-08 (×4): 100 mg via ORAL
  Filled 2021-11-05 (×4): qty 2

## 2021-11-05 SURGICAL SUPPLY — 10 items
CANNULA 5F STIFF (CANNULA) ×1 IMPLANT
CATH ANGIO 5F PIGTAIL 65CM (CATHETERS) ×1 IMPLANT
COVER PROBE U/S 5X48 (MISCELLANEOUS) ×1 IMPLANT
DEVICE STARCLOSE SE CLOSURE (Vascular Products) ×1 IMPLANT
GLIDEWIRE ADV .035X260CM (WIRE) ×1 IMPLANT
PACK ANGIOGRAPHY (CUSTOM PROCEDURE TRAY) ×2 IMPLANT
SHEATH BRITE TIP 5FRX11 (SHEATH) ×1 IMPLANT
SYR MEDRAD MARK 7 150ML (SYRINGE) ×1 IMPLANT
TUBING CONTRAST HIGH PRESS 72 (TUBING) ×1 IMPLANT
WIRE GUIDERIGHT .035X150 (WIRE) ×1 IMPLANT

## 2021-11-05 NOTE — Progress Notes (Addendum)
MD notified of abnormal pulse, pulse fluctuating anywhere from 40s-160's. Pt asymptomatic, no complains of pain, per pt feels "fine" no hx of afib, Dr. Frederick Peers notified, received order for STAT EKG. ? 11/05/21 1710  ?Assess: MEWS Score  ?Temp 97.8 ?F (36.6 ?C)  ?BP 132/68  ?Pulse Rate (!) 167  ?Resp 18  ?SpO2 95 %  ?O2 Device Room Air  ?Assess: MEWS Score  ?MEWS Temp 0  ?MEWS Systolic 0  ?MEWS Pulse 3  ?MEWS RR 0  ?MEWS LOC 0  ?MEWS Score 3  ?MEWS Score Color Yellow  ?Assess: if the MEWS score is Yellow or Red  ?Were vital signs taken at a resting state? Yes  ?Focused Assessment Change from prior assessment (see assessment flowsheet)  ?Does the patient meet 2 or more of the SIRS criteria? No  ?MEWS guidelines implemented *See Row Information* Yes  ?Treat  ?MEWS Interventions Escalated (See documentation below) ?(Dr. Frederick Peers notified, ordered STAT ekg)  ?Take Vital Signs  ?Increase Vital Sign Frequency  Yellow: Q 2hr X 2 then Q 4hr X 2, if remains yellow, continue Q 4hrs  ?Escalate  ?MEWS: Escalate Yellow: discuss with charge nurse/RN and consider discussing with provider and RRT  ?Notify: Charge Nurse/RN  ?Name of Charge Nurse/RN Notified Thayer Ohm RN  ?Date Charge Nurse/RN Notified 11/05/21  ?Time Charge Nurse/RN Notified 1715  ?Notify: Provider  ?Provider Name/Title Dr. Frederick Peers  ?Date Provider Notified 11/05/21  ?Time Provider Notified 1715  ?Notification Type Page  ?Notification Reason Change in status  ?Provider response See new orders  ?Date of Provider Response 11/05/21  ?Time of Provider Response 1715  ?Document  ?Patient Outcome Not stable and remains on department  ?Progress note created (see row info) Yes  ?Assess: SIRS CRITERIA  ?SIRS Temperature  0  ?SIRS Pulse 1  ?SIRS Respirations  0  ?SIRS WBC 0  ?SIRS Score Sum  1  ? ? ?

## 2021-11-05 NOTE — Progress Notes (Addendum)
Physical Therapy Treatment ?Patient Details ?Name: Isabella White ?MRN: [id number] ?DOB: 30-Dec-1945 ?Today's Date: 11/05/2021 ? ? ?History of Present Illness Isabella White is a pleasant 76 y.o. female with medical history significant for hypertension, insulin-dependent diabetes mellitus, asthma, OSA, depression, and anxiety, now presenting to the emergency department with right ankle pain.  Patient has been seen by podiatry recently for right foot pain and has been using a cane due to this, but had otherwise been in her usual state of health when she had her right ankle "give out" today, leading to a fall and acute right ankle pain.  She denies hitting her head or suffering any other injury.  She denies any recent illness, is fully independent, and never experiences angina. Patient is s/p repair of calcaneal avulsion fx and achillies tendon rupture. NWB R LE ? ?  ?PT Comments  ? ? Pt seen for PT tx appearing very fatigued but agreeable to bed level exercises. PT educates pt on RLE strengthening exercises & importance of performing them until she can weight bear on RLE again. Pt voices understanding. PT observed wound vac to be unplugged & plugged it back in to outlet. Pt is able to scoot to [facility] with bed in trendelenburg position & using BUE & rails. Will continue to follow pt acutely to progress bed mobility, transfers & gait as able. ? ?Addendum: gauze noted to L groin to be intact & dry, no drainage noted. ?   ?Recommendations for follow up therapy are one component of a multi-disciplinary discharge planning process, led by the attending physician.  Recommendations may be updated based on patient status, additional functional criteria and insurance authorization. ? ?Follow Up Recommendations ? Skilled nursing-short term rehab (<3 hours/day) ?  ?  ?Assistance Recommended at Discharge Intermittent Supervision/Assistance  ?Patient can return home with the following A little help with bathing/dressing/bathroom;A lot of  help with walking and/or transfers;Help with stairs or ramp for entrance ?  ?Equipment Recommendations ? None recommended by PT  ?  ?Recommendations for Other Services   ? ? ?  ?Precautions / Restrictions Precautions ?Precautions: Fall ?Restrictions ?Weight Bearing Restrictions: Yes ?RLE Weight Bearing: Non weight bearing  ?  ? ?Mobility ? Bed Mobility ?  ?  ?  ?  ?  ?  ?  ?  ?  ? ?Transfers ?  ?  ?  ?  ?  ?  ?  ?  ?  ?  ?  ? ?Ambulation/Gait ?  ?  ?  ?  ?  ?  ?  ?  ? ? ?Stairs ?  ?  ?  ?  ?  ? ? ?Wheelchair Mobility ?  ? ?Modified Rankin (Stroke Patients Only) ?  ? ? ?  ?Balance   ?  ?  ?  ?  ?  ?  ?  ?  ?  ?  ?  ?  ?  ?  ?  ?  ?  ?  ?  ? ?  ?Cognition Arousal/Alertness: Awake/alert ?Behavior During Therapy: [facility] for tasks assessed/performed ?Overall Cognitive Status: Within Functional Limits for tasks assessed ?  ?  ?  ?  ?  ?  ?  ?  ?  ?  ?  ?  ?  ?  ?  ?  ?General Comments: Pt fatigued today ?  ?  ? ?  ?Exercises General Exercises - Lower Extremity ?Heel Slides: AROM, Strengthening, Right, Supine ?Hip ABduction/ADduction: AROM, Strengthening, Right, 20 reps, Supine (hip  abduction slides) ?Straight Leg Raises: AROM, 20 reps, Strengthening, Supine, Right ? ?  ?General Comments   ?  ?  ? ?Pertinent Vitals/Pain Pain Assessment ?Pain Assessment: No/denies pain ?Faces Pain Scale: No hurt  ? ? ?Home Living   ?  ?  ?  ?  ?  ?  ?  ?  ?  ?   ?  ?Prior Function    ?  ?  ?   ? ?PT Goals (current goals can now be found in the care plan section) Acute Rehab PT Goals ?Patient Stated Goal: to go to rehab then home ?PT Goal Formulation: With patient ?Time For Goal Achievement: 11/18/21 ?Potential to Achieve Goals: Good ?Progress towards PT goals: Progressing toward goals ? ?  ?Frequency ? ? ? BID ? ? ? ?  ?PT Plan Current plan remains appropriate  ? ? ?Co-evaluation   ?  ?  ?  ?  ? ?  ?AM-PAC PT "6 Clicks" Mobility   ?Outcome Measure ? Help needed turning from your back to your side while in a flat bed without using bedrails?: A  Little ?Help needed moving from lying on your back to sitting on the side of a flat bed without using bedrails?: A Little ?Help needed moving to and from a bed to a chair (including a wheelchair)?: A Little ?Help needed standing up from a chair using your arms (e.g., wheelchair or bedside chair)?: A Little ?Help needed to walk in hospital room?: Total ?Help needed climbing 3-5 steps with a railing? : Total ?6 Click Score: 14 ? ?  ?End of Session   ?  ?Patient left: in bed;with call bell/phone within reach;with bed alarm set (wound vac plugged in (not plugged in on PT arrival), SCD donned on LLE) ?  ?PT Visit Diagnosis: Unsteadiness on feet (R26.81);Muscle weakness (generalized) (M62.81);Other abnormalities of gait and mobility (R26.89);History of falling (Z91.81) ?  ? ? ?Time: 2130-8657 ?PT Time Calculation (min) (ACUTE ONLY): 11 min ? ?Charges:  $Therapeutic Exercise: 8-22 mins          ?          ? ?Aleda Grana, PT, DPT ?11/05/21, 3:15 PM ? ? ? ?Sandi Mariscal ?11/05/2021, 3:10 PM ? ?

## 2021-11-05 NOTE — Op Note (Signed)
[facility] ? Percutaneous Study/Intervention Procedural Note ? ? ?Date of Surgery: 11/05/2021,9:16 AM ? ?Surgeon:Hanz Winterhalter, Latina Craver  ? ?Pre-operative Diagnosis: Atherosclerotic occlusive disease bilateral lower extremities with calcaneal fracture and Achilles tendon repair right lower extremity ? ?Post-operative diagnosis:  Same ? ?Procedure(s) Performed: ? 1.  Abdominal aortogram ? 2.  Right lower extremity angiography third order catheter placement ? 3.  Ultrasound-guided access to the left common femoral ? 4.  StarClose left ?  ? ?Anesthesia: Conscious sedation was administered by the interventional radiology RN under my direct supervision. IV Versed plus fentanyl were utilized. Continuous ECG, pulse oximetry and blood pressure was monitored throughout the entire procedure.  Conscious sedation was administered for a total of 19 minutes. ? ?Sheath: 5 French 11 cm Pinnacle sheath left common femoral retrograde ? ?Contrast: 40 cc  ? ?Fluoroscopy Time: 2.6 minutes ? ?Indications:  The patient presents to [facility] with calcaneus fracture and Achilles tendon injury of the right foot and heel.  Pedal pulses are nonpalpable bilaterally suggesting atherosclerotic occlusive disease.  The risks and benefits as well as alternative therapies for lower extremity revascularization are reviewed with the patient all questions are answered the patient agrees to proceed.  The patient is therefore undergoing angiography with the hope for intervention for limb salvage. ? ? ?Procedure:  Isabella Million Hensleyis a 76 y.o. female who was identified and appropriate procedural time out was performed.  The patient was then placed supine on the table and prepped and draped in the usual sterile fashion.  Ultrasound was used to evaluate the left common femoral artery.  It was echolucent and pulsatile indicating it is patent .  An ultrasound image was acquired for the permanent record.  A micropuncture needle was used  to access the left common femoral artery under direct ultrasound guidance.  The microwire was then advanced under fluoroscopic guidance without difficulty followed by the micro-sheath.  A 0.035 J wire was advanced without resistance and a 5Fr sheath was placed.   ? ?Pigtail catheter was then advanced to the level of T12 and AP projection of the aorta was obtained. Pigtail catheter was then repositioned to above the bifurcation and LAO view of the pelvis was obtained. Stiff angled Glidewire and pigtail catheter was then used across the bifurcation and the catheter was positioned in the distal external iliac artery.  RAO of the right groin was then obtained. Wire was reintroduced and negotiated into the SFA and the catheter was advanced into the SFA. Distal runoff was then performed. ? ?After review of the images the catheter was removed over wire and an LAO view of the groin was obtained. StarClose device was deployed without difficulty. ? ? ?Findings:  ? Aortogram: Abdominal aorta is opacified with a bolus injection contrast.  It is smooth in contour there is no calcifications noted it is free of any significant atherosclerotic changes strictures or stenoses.  Bilateral renal arteries are noted they are widely patent as single and normal nephrograms identified.  Bilateral common internal and external iliac arteries are widely patent. ? Right Lower Extremity: Right common femoral profundofemoral superficial femoral and popliteal arteries are widely patent.  Trifurcation is patent with a large tibioperoneal trunk and a very large posterior tibial artery as well as a normal sized peroneal artery.  Peroneal and posterior tibial artery across the ankle and there is excellent filling of the heel by the posterior tibial.  The anterior tibial is identified at its takeoff but is quite atretic  and is occluded throughout the majority of its course.  The peroneal actually appears to fill the dorsalis pedis.  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 ? Left Lower Extremity: The common femoral and visualized portions of the profunda femoris and SFA are widely patent ? ? ?Disposition: Patient was taken to the recovery room in stable condition having tolerated the procedure well. ? ?Levora Dredge ?11/05/2021,9:16 AM  ?

## 2021-11-05 NOTE — Interval H&P Note (Signed)
History and Physical Interval Note: ? ?11/05/2021 ?7:41 AM ? ?Isabella White  has presented today for surgery, with the diagnosis of Achilles Tendon Rupture.  The various methods of treatment have been discussed with the patient and family. After consideration of risks, benefits and other options for treatment, the patient has consented to  Procedure(s): ?ACHILLES TENDON REPAIR (Right) as a surgical intervention.  The patient's history has been reviewed, patient examined, no change in status, stable for surgery.  I have reviewed the patient's chart and labs.  Questions were answered to the patient's satisfaction.   ? ? ?Levora Dredge ? ? ?

## 2021-11-05 NOTE — Progress Notes (Signed)
?Progress Note ? ? ? ?Isabella White   ?[id number]  ?DOB: [birth year]  ?DOA: 11/02/2021     3 ?PCP: Adin Hector, MD ? ?Initial CC: fall ? ?Hospital Course: ?Ms. Hauver is a 76 yo female with PMH HTN, IDDM, asthma, OSA, depression/anxiety who presented after a fall resulting in right ankle pain.  She had been having ongoing right foot pain and occasional sensation that her right leg would give out.  This again happened on admission and she fell.  She was visiting a family member in the ICU when she had fallen in the hallway. ?Initial x-ray showed an avulsion fracture of the posterior calcaneus at the Achilles tendon with displaced bone fragment appreciated.  This was confirmed on MRI.  She was evaluated by orthopedic surgery with recommendation for surgical repair. ? ?Interval History:  ?No events overnight. Angiography went well this morning. She is now ready for going to SNF once a facility is found. She felt okay this am.  ? ?Assessment and Plan: ?* Avulsion fracture of right calcaneus ?- s/p mechanical fall ?- imaging shows avulsion fracture of right calcaneus with retraction of Achilles tendon from its insertion ?- s/p surgical repair on 3/15 ?- plan is for NWB to RLE and continue Prevena VAC at discharge with outpt follow up with podiatry in 1 week ?- s/p angiography on 3/17 (no critical stenosis or intervention needed) ?- Continue pain control ? ?Refusal of blood transfusions as patient is Jehovah's Witness ?- Patient declines blood transfusions if needed ? ?Depression ?- Continue Zoloft ? ?Hypertension ?- Continue Norvasc ?-BP uptrending, okay to resume losartan ? ?COPD (chronic obstructive pulmonary disease) ([place]) ?- no s/s exacerbation ?- denies tobacco use ?- continue inhalers ? ?Sleep apnea ?- Denies CPAP use at home and declines CPAP in the hospital ? ?Diabetes mellitus without complication ([place]) ?- Continue SSI and CBG monitoring ? ? ?Old records reviewed in assessment of this  patient ? ?Antimicrobials: ? ? ?DVT prophylaxis:  ?SCDs Start: 11/02/21 1925 ? ? ?Code Status:   Code Status: Full Code ? ?Disposition Plan:  SNF/rehab once bed avail. Medically stable ?Status is: Inpt ? ?Objective: ?Blood pressure (!) 152/69, pulse 77, temperature 98.3 ?F (36.8 ?C), temperature source Oral, resp. rate 18, height 5\' 1"  (1.549 m), weight 85.3 kg, SpO2 100 %.  ?Examination:  ?Physical Exam ?Constitutional:   ?   Appearance: Normal appearance.  ?HENT:  ?   Head: Normocephalic and atraumatic.  ?   Mouth/Throat:  ?   Mouth: Mucous membranes are moist.  ?Eyes:  ?   Extraocular Movements: Extraocular movements intact.  ?Cardiovascular:  ?   Rate and Rhythm: Normal rate and regular rhythm.  ?Pulmonary:  ?   Effort: Pulmonary effort is normal.  ?   Breath sounds: Normal breath sounds.  ?Abdominal:  ?   General: Bowel sounds are normal. There is no distension.  ?   Palpations: Abdomen is soft.  ?   Tenderness: There is no abdominal tenderness.  ?Musculoskeletal:  ?   Cervical back: Normal range of motion and neck supple.  ?   Comments: Wound vac in place on right foot and large surgical dressing and bandage in place  ?Skin: ?   General: Skin is warm and dry.  ?Neurological:  ?   General: No focal deficit present.  ?   Mental Status: She is alert.  ?Psychiatric:     ?   Mood and Affect: Mood normal.     ?  Behavior: Behavior normal.  ?  ? ?Consultants:  ?Orthopedic surgery ? ?Procedures:  ?Achilles tendon avulsion repair, 3/15 ?RLE angiography, 3/17 ? ?Data Reviewed: ?Results for orders placed or performed during the hospital encounter of 11/02/21 (from the past 24 hour(s))  ?Glucose, capillary     Status: Abnormal  ? Collection Time: 11/04/21  4:42 PM  ?Result Value Ref Range  ? Glucose-Capillary 135 (H) 70 - 99 mg/dL  ?Glucose, capillary     Status: Abnormal  ? Collection Time: 11/04/21  8:03 PM  ?Result Value Ref Range  ? Glucose-Capillary 200 (H) 70 - 99 mg/dL  ?Glucose, capillary     Status: Abnormal  ?  Collection Time: 11/04/21  9:33 PM  ?Result Value Ref Range  ? Glucose-Capillary 167 (H) 70 - 99 mg/dL  ? Comment 1 Notify RN   ?Glucose, capillary     Status: Abnormal  ? Collection Time: 11/05/21 12:21 AM  ?Result Value Ref Range  ? Glucose-Capillary 205 (H) 70 - 99 mg/dL  ?Basic metabolic panel     Status: Abnormal  ? Collection Time: 11/05/21  3:35 AM  ?Result Value Ref Range  ? Sodium 140 135 - 145 mmol/L  ? Potassium 4.6 3.5 - 5.1 mmol/L  ? Chloride 105 98 - 111 mmol/L  ? CO2 27 22 - 32 mmol/L  ? Glucose, Bld 153 (H) 70 - 99 mg/dL  ? BUN 20 8 - 23 mg/dL  ? Creatinine, Ser 0.97 0.44 - 1.00 mg/dL  ? Calcium 8.6 (L) 8.9 - 10.3 mg/dL  ? GFR, Estimated >60 >60 mL/min  ? Anion gap 8 5 - 15  ?CBC with Differential/Platelet     Status: Abnormal  ? Collection Time: 11/05/21  3:35 AM  ?Result Value Ref Range  ? WBC 6.1 4.0 - 10.5 K/uL  ? RBC 4.06 3.87 - 5.11 MIL/uL  ? Hemoglobin 11.2 (L) 12.0 - 15.0 g/dL  ? HCT 36.1 36.0 - 46.0 %  ? MCV 88.9 80.0 - 100.0 fL  ? MCH 27.6 26.0 - 34.0 pg  ? MCHC 31.0 30.0 - 36.0 g/dL  ? RDW 15.0 11.5 - 15.5 %  ? Platelets 145 (L) 150 - 400 K/uL  ? nRBC 0.0 0.0 - 0.2 %  ? Neutrophils Relative % 69 %  ? Neutro Abs 4.3 1.7 - 7.7 K/uL  ? Lymphocytes Relative 20 %  ? Lymphs Abs 1.2 0.7 - 4.0 K/uL  ? Monocytes Relative 8 %  ? Monocytes Absolute 0.5 0.1 - 1.0 K/uL  ? Eosinophils Relative 1 %  ? Eosinophils Absolute 0.1 0.0 - 0.5 K/uL  ? Basophils Relative 1 %  ? Basophils Absolute 0.0 0.0 - 0.1 K/uL  ? Immature Granulocytes 1 %  ? Abs Immature Granulocytes 0.03 0.00 - 0.07 K/uL  ?Magnesium     Status: None  ? Collection Time: 11/05/21  3:35 AM  ?Result Value Ref Range  ? Magnesium 2.3 1.7 - 2.4 mg/dL  ?Glucose, capillary     Status: Abnormal  ? Collection Time: 11/05/21  4:50 AM  ?Result Value Ref Range  ? Glucose-Capillary 145 (H) 70 - 99 mg/dL  ?Glucose, capillary     Status: Abnormal  ? Collection Time: 11/05/21  8:01 AM  ?Result Value Ref Range  ? Glucose-Capillary 146 (H) 70 - 99 mg/dL   ?Glucose, capillary     Status: Abnormal  ? Collection Time: 11/05/21  8:25 AM  ?Result Value Ref Range  ? Glucose-Capillary 153 (H) 70 - 99 mg/dL  ?  Comment 1 Document in Chart   ?Glucose, capillary     Status: Abnormal  ? Collection Time: 11/05/21  9:30 AM  ?Result Value Ref Range  ? Glucose-Capillary 155 (H) 70 - 99 mg/dL  ?Glucose, capillary     Status: Abnormal  ? Collection Time: 11/05/21 11:50 AM  ?Result Value Ref Range  ? Glucose-Capillary 136 (H) 70 - 99 mg/dL  ? Comment 1 Notify RN   ? Comment 2 Document in Chart   ?  ?I have Reviewed nursing notes, Vitals, and Lab results since pt's last encounter. Pertinent lab results : see above ?I have ordered test including BMP, CBC, Mg ?I have reviewed the last note from staff over past 24 hours ?I have discussed pt's care plan and test results with nursing staff, case manager ? ? LOS: 3 days  ? ?Dwyane Dee, MD ?[facility] ?11/05/2021, 2:09 PM ? ?

## 2021-11-05 NOTE — Progress Notes (Signed)
PT Cancellation Note ? ?Patient Details ?Name: Isabella White ?MRN: [id number] ?DOB: May 23, 1946 ? ? ?Cancelled Treatment:    Reason Eval/Treat Not Completed: Patient at procedure or test/unavailable Chart reviewed & nurse reports pt is off floor for angiography. Will f/u as able. ? ?Aleda Grana, PT, DPT ?11/05/21, 8:54 AM ? ? ?Isabella White ?11/05/2021, 8:53 AM ?

## 2021-11-05 NOTE — TOC Progression Note (Signed)
Transition of Care (TOC) - Progression Note  ? ? ?Patient Details  ?Name: Isabella White ?MRN: [id number] ?Date of Birth: 1946/03/12 ? ?Transition of Care (TOC) CM/SW Contact  ?Marlowe Sax, RN ?Phone Number: ?11/05/2021, 12:35 PM ? ?Clinical Narrative:   Spoke with the patient and reviewed the bed offers, she chose [facility], I notified [place], I called [employer] to start the Ins process ? ? ? ? ?Expected Discharge Plan: Home w Home Health Services ?Barriers to Discharge: Continued Medical Work up ? ?Expected Discharge Plan and Services ?Expected Discharge Plan: Home w Home Health Services ?  ?Discharge Planning Services: CM Consult ?  ?Living arrangements for the past 2 months: Single Family Home ?                ?  ?  ?  ?  ?  ?  ?  ?  ?  ?  ? ? ?Social Determinants of Health (SDOH) Interventions ?  ? ?Readmission Risk Interventions ?No flowsheet data found. ? ?

## 2021-11-05 NOTE — Plan of Care (Signed)
  Problem: Health Behavior/Discharge Planning: Goal: Ability to manage health-related needs will improve Outcome: Progressing   Problem: Clinical Measurements: Goal: Ability to maintain clinical measurements within normal limits will improve Outcome: Progressing Goal: Diagnostic test results will improve Outcome: Progressing Goal: Respiratory complications will improve Outcome: Progressing   

## 2021-11-05 NOTE — TOC Progression Note (Signed)
Transition of Care (TOC) - Progression Note  ? ? ?Patient Details  ?Name: Isabella White ?MRN: [id number] ?Date of Birth: 18-Jan-1946 ? ?Transition of Care (TOC) CM/SW Contact  ?Marlowe Sax, RN ?Phone Number: ?11/05/2021, 3:54 PM ? ?Clinical Narrative:   Ins approved to go to [facility] auth [id number], EMS approval Berkley Harvey [id number], to go [facility] ? ? ? ?Expected Discharge Plan: Home w Home Health Services ?Barriers to Discharge: Continued Medical Work up ? ?Expected Discharge Plan and Services ?Expected Discharge Plan: Home w Home Health Services ?  ?Discharge Planning Services: CM Consult ?  ?Living arrangements for the past 2 months: Single Family Home ?                ?  ?  ?  ?  ?  ?  ?  ?  ?  ?  ? ? ?Social Determinants of Health (SDOH) Interventions ?  ? ?Readmission Risk Interventions ?No flowsheet data found. ? ?

## 2021-11-05 NOTE — Assessment & Plan Note (Signed)
-   Patient declines blood transfusions if needed ?

## 2021-11-06 DIAGNOSIS — S92031A Displaced avulsion fracture of tuberosity of right calcaneus, initial encounter for closed fracture: Secondary | ICD-10-CM | POA: Diagnosis not present

## 2021-11-06 LAB — GLUCOSE, CAPILLARY
Glucose-Capillary: 129 mg/dL — ABNORMAL HIGH (ref 70–99)
Glucose-Capillary: 147 mg/dL — ABNORMAL HIGH (ref 70–99)
Glucose-Capillary: 147 mg/dL — ABNORMAL HIGH (ref 70–99)
Glucose-Capillary: 158 mg/dL — ABNORMAL HIGH (ref 70–99)
Glucose-Capillary: 169 mg/dL — ABNORMAL HIGH (ref 70–99)
Glucose-Capillary: 201 mg/dL — ABNORMAL HIGH (ref 70–99)

## 2021-11-06 LAB — CBC WITH DIFFERENTIAL/PLATELET
Abs Immature Granulocytes: 0.03 10*3/uL (ref 0.00–0.07)
Basophils Absolute: 0 10*3/uL (ref 0.0–0.1)
Basophils Relative: 1 %
Eosinophils Absolute: 0.2 10*3/uL (ref 0.0–0.5)
Eosinophils Relative: 4 %
HCT: 39.2 % (ref 36.0–46.0)
Hemoglobin: 12.1 g/dL (ref 12.0–15.0)
Immature Granulocytes: 1 %
Lymphocytes Relative: 16 %
Lymphs Abs: 0.8 10*3/uL (ref 0.7–4.0)
MCH: 27.3 pg (ref 26.0–34.0)
MCHC: 30.9 g/dL (ref 30.0–36.0)
MCV: 88.5 fL (ref 80.0–100.0)
Monocytes Absolute: 0.3 10*3/uL (ref 0.1–1.0)
Monocytes Relative: 7 %
Neutro Abs: 3.8 10*3/uL (ref 1.7–7.7)
Neutrophils Relative %: 71 %
Platelets: 153 10*3/uL (ref 150–400)
RBC: 4.43 MIL/uL (ref 3.87–5.11)
RDW: 15 % (ref 11.5–15.5)
WBC: 5.2 10*3/uL (ref 4.0–10.5)
nRBC: 0 % (ref 0.0–0.2)

## 2021-11-06 LAB — BASIC METABOLIC PANEL
Anion gap: 8 (ref 5–15)
BUN: 16 mg/dL (ref 8–23)
CO2: 24 mmol/L (ref 22–32)
Calcium: 8.7 mg/dL — ABNORMAL LOW (ref 8.9–10.3)
Chloride: 106 mmol/L (ref 98–111)
Creatinine, Ser: 0.94 mg/dL (ref 0.44–1.00)
GFR, Estimated: >60 mL/min (ref >60)
Glucose, Bld: 177 mg/dL — ABNORMAL HIGH (ref 70–99)
Potassium: 4.2 mmol/L (ref 3.5–5.1)
Sodium: 138 mmol/L (ref 135–145)

## 2021-11-06 LAB — MAGNESIUM: Magnesium: 2.1 mg/dL (ref 1.7–2.4)

## 2021-11-06 MED ORDER — SENNOSIDES-DOCUSATE SODIUM 8.6-50 MG PO TABS
1.0000 | ORAL_TABLET | Freq: Two times a day (BID) | ORAL | Status: DC
  Administered 2021-11-08: 1 via ORAL
  Filled 2021-11-06 (×4): qty 1

## 2021-11-06 MED ORDER — POLYETHYLENE GLYCOL 3350 17 G PO PACK
17.0000 g | PACK | Freq: Every day | ORAL | Status: DC
  Filled 2021-11-06 (×3): qty 1

## 2021-11-06 NOTE — Progress Notes (Signed)
Physical Therapy Treatment ?Patient Details ?Name: Isabella White ?MRN: [id number] ?DOB: [birth year] ?Today's Date: 11/06/2021 ? ? ?History of Present Illness Isabella White is a pleasant 76 y.o. female with medical history significant for hypertension, insulin-dependent diabetes mellitus, asthma, OSA, depression, and anxiety, now presenting to the emergency department with right ankle pain.  Patient has been seen by podiatry recently for right foot pain and has been using a cane due to this, but had otherwise been in her usual state of health when she had her right ankle "give out" today, leading to a fall and acute right ankle pain.  She denies hitting her head or suffering any other injury.  She denies any recent illness, is fully independent, and never experiences angina. Patient is s/p repair of calcaneal avulsion fx and achillies tendon rupture. NWB R LE ? ?  ?PT Comments  ? ? Pt was sitting in recliner requesting to get back into bed after having another BM. She was able to stand from lower recliner height with slightly more assistance than from EOB. She stood 2 x from recliner prior to stand pivot to [facility]. Successful BM prior to standing and pivoting to bed. Pt does well with maintaining NWB. Did have a little blood when performing hygiene care after BM (hemorrhoids?). RN was made aware. Pt overall is doing well but limited by wt bearing restrictions. She does not have assistance at home. Recommend DC to rehab to improve pt's abilities and independence with ADLs. Acute PT will continue to follow and progress as able per current POC.   ?  ?Recommendations for follow up therapy are one component of a multi-disciplinary discharge planning process, led by the attending physician.  Recommendations may be updated based on patient status, additional functional criteria and insurance authorization. ? ?Follow Up Recommendations ? Skilled nursing-short term rehab (<3 hours/day) ?  ?  ?Assistance Recommended at Discharge  Intermittent Supervision/Assistance  ?Patient can return home with the following A little help with bathing/dressing/bathroom;A lot of help with walking and/or transfers;Help with stairs or ramp for entrance ?  ?Equipment Recommendations ? Rolling walker (2 wheels);BSC/3in1;Wheelchair (measurements PT);Wheelchair cushion (measurements PT)  ?  ?   ?Precautions / Restrictions Precautions ?Precautions: Fall ?Restrictions ?Weight Bearing Restrictions: Yes ?RLE Weight Bearing: Non weight bearing  ?  ? ?Mobility ? Bed Mobility ?Overal bed mobility: Needs Assistance ?Bed Mobility: Sit to Supine ?  ?  ?Supine to sit: Min assist ?Sit to supine: Min assist ?  ?General bed mobility comments: pt required assistance progress BLEs back into bed from EOB short sit. ?  ? ?Transfers ?Overall transfer level: Needs assistance ?Equipment used: Rolling walker (2 wheels) ?Transfers: Sit to/from Stand ?Sit to Stand: Min assist, Mod assist ?Stand pivot transfers: Min assist, Mod assist ?  ?  ?  ?  ?General transfer comment: Pt required slightly more assistance to stand from lower recliner height surface. Vcs for handplacement, fwd wt shift, and vcs for maintaing NWB. pt still able to stand from lower height while adhereing to proper wt bearing but did require more assistance overall ?  ? ?Ambulation/Gait ?   ?General Gait Details: pt is NWB and is unable to safely porgress to "hopping" gait pattern. Fatigued quickly with transfers and static standing task. ? ?  ?Balance Overall balance assessment: Needs assistance ?Sitting-balance support: Single extremity supported, Bilateral upper extremity supported ?Sitting balance-Leahy Scale: Good ?Sitting balance - Comments: no LOB in sitting at EOB and on BSC ?  ?Standing balance support: Bilateral  upper extremity supported, During functional activity, Reliant on assistive device for balance ?Standing balance-Leahy Scale: Fair ?Standing balance comment: high fall risk mostly due to NWB restrictions  and reliance on RW for support ?  ?  ?  ?Cognition Arousal/Alertness: Awake/alert ?Behavior During Therapy: [facility] for tasks assessed/performed ?Overall Cognitive Status: Within Functional Limits for tasks assessed ?  ?   ?General Comments: Pt is A and O x 4 and pleasant and cooperative ?  ?  ? ?  ?   ?General Comments General comments (skin integrity, edema, etc.): Author issued HEP however pt had several vistors arrive and requested not to perform until later in the day. ?  ?  ? ?Pertinent Vitals/Pain Pain Assessment ?Pain Assessment: 0-10 ?Pain Score: 5  ?Faces Pain Scale: Hurts little more ?Pain Location: R LE ?Pain Descriptors / Indicators: Discomfort, Throbbing ?Pain Intervention(s): Limited activity within patient's tolerance, Monitored during session, Premedicated before session, Repositioned, Ice applied  ? ? ? ?PT Goals (current goals can now be found in the care plan section) Acute Rehab PT Goals ?Patient Stated Goal: to go to rehab then home ?Progress towards PT goals: Progressing toward goals ? ?  ?Frequency ? ? ? BID ? ? ? ?  ?PT Plan Current plan remains appropriate  ? ? ?   ?AM-PAC PT "6 Clicks" Mobility   ?Outcome Measure ? Help needed turning from your back to your side while in a flat bed without using bedrails?: A Little ?Help needed moving from lying on your back to sitting on the side of a flat bed without using bedrails?: A Little ?Help needed moving to and from a bed to a chair (including a wheelchair)?: A Little ?Help needed standing up from a chair using your arms (e.g., wheelchair or bedside chair)?: A Little ?Help needed to walk in hospital room?: Total ?Help needed climbing 3-5 steps with a railing? : Total ?6 Click Score: 14 ? ?  ?End of Session Equipment Utilized During Treatment: Gait belt ?Activity Tolerance: Patient tolerated treatment well;Other (comment) (limited by NWB status) ?Patient left: in bed;with call bell/phone within reach;with bed alarm set;with family/visitor  present ?Nurse Communication: Mobility status ?PT Visit Diagnosis: Unsteadiness on feet (R26.81);Muscle weakness (generalized) (M62.81);Other abnormalities of gait and mobility (R26.89);History of falling (Z91.81) ?Pain - Right/Left: Right ?Pain - part of body: Ankle and joints of foot ?  ? ? ?Time: 1355-1415 ?PT Time Calculation (min) (ACUTE ONLY): 20 min ? ?Charges:  $Therapeutic Activity: 8-22 mins          ?          ? ?Jetta Lout PTA ?11/06/21, 3:33 PM  ? ?

## 2021-11-06 NOTE — Progress Notes (Signed)
?Progress Note ? ? ? ?Isabella White   ?[id number]  ?DOB: Sep 02, 1945  ?DOA: 11/02/2021     4 ?PCP: Adin Hector, MD ? ?Initial CC: fall ? ?Hospital Course: ?Ms. Fahl is a 76 yo female with PMH HTN, IDDM, asthma, OSA, depression/anxiety who presented after a fall resulting in right ankle pain.  She had been having ongoing right foot pain and occasional sensation that her right leg would give out.  This again happened on admission and she fell.  She was visiting a family member in the ICU when she had fallen in the hallway. ?Initial x-ray showed an avulsion fracture of the posterior calcaneus at the Achilles tendon with displaced bone fragment appreciated.  This was confirmed on MRI.  She was evaluated by orthopedic surgery with recommendation for surgical repair. ? ?Interval History:  ?No events overnight.  Resting comfortably in bed.  Planning to work with physical therapy today.  Tentative plan is for discharging to [facility] on Monday. ? ?Assessment and Plan: ?* Avulsion fracture of right calcaneus ?- s/p mechanical fall ?- imaging shows avulsion fracture of right calcaneus with retraction of Achilles tendon from its insertion ?- s/p surgical repair on 3/15 ?- plan is for NWB to RLE and continue Prevena VAC at discharge with outpt follow up with podiatry in 1 week ?- s/p angiography on 3/17 (no critical stenosis or intervention needed) ?- Continue pain control ?-Bed accepted at [facility].  Okay to discharge on Monday ? ?Refusal of blood transfusions as patient is Jehovah's Witness ?- Patient declines blood transfusions if needed ? ?Depression ?- Continue Zoloft ? ?Hypertension ?- Continue Norvasc ?-BP uptrending, okay to resume losartan ? ?COPD (chronic obstructive pulmonary disease) ([place]) ?- no s/s exacerbation ?- denies tobacco use ?- continue inhalers ? ?Sleep apnea ?- Denies CPAP use at home and declines CPAP in the hospital ? ?Diabetes mellitus without complication ([place]) ?- Continue SSI  and CBG monitoring ? ? ?Old records reviewed in assessment of this patient ? ?Antimicrobials: ? ? ?DVT prophylaxis:  ?SCDs Start: 11/02/21 1925 ? ? ?Code Status:   Code Status: Full Code ? ?Disposition Plan: [facility] on Monday.  Medically stable ?Status is: Inpt ? ?Objective: ?Blood pressure (!) 148/68, pulse 80, temperature 97.7 ?F (36.5 ?C), resp. rate 16, height 5\' 1"  (1.549 m), weight 85.3 kg, SpO2 98 %.  ?Examination:  ?Physical Exam ?Constitutional:   ?   Appearance: Normal appearance.  ?HENT:  ?   Head: Normocephalic and atraumatic.  ?   Mouth/Throat:  ?   Mouth: Mucous membranes are moist.  ?Eyes:  ?   Extraocular Movements: Extraocular movements intact.  ?Cardiovascular:  ?   Rate and Rhythm: Normal rate and regular rhythm.  ?Pulmonary:  ?   Effort: Pulmonary effort is normal.  ?   Breath sounds: Normal breath sounds.  ?Abdominal:  ?   General: Bowel sounds are normal. There is no distension.  ?   Palpations: Abdomen is soft.  ?   Tenderness: There is no abdominal tenderness.  ?Musculoskeletal:  ?   Cervical back: Normal range of motion and neck supple.  ?   Comments: Wound vac in place on right foot and large surgical dressing and bandage in place  ?Skin: ?   General: Skin is warm and dry.  ?Neurological:  ?   General: No focal deficit present.  ?   Mental Status: She is alert.  ?Psychiatric:     ?   Mood and Affect:  Mood normal.     ?   Behavior: Behavior normal.  ?  ? ?Consultants:  ?Orthopedic surgery ? ?Procedures:  ?Achilles tendon avulsion repair, 3/15 ?RLE angiography, 3/17 ? ?Data Reviewed: ?Results for orders placed or performed during the hospital encounter of 11/02/21 (from the past 24 hour(s))  ?Glucose, capillary     Status: Abnormal  ? Collection Time: 11/05/21  3:31 PM  ?Result Value Ref Range  ? Glucose-Capillary 234 (H) 70 - 99 mg/dL  ?Glucose, capillary     Status: Abnormal  ? Collection Time: 11/05/21  8:54 PM  ?Result Value Ref Range  ? Glucose-Capillary 156 (H) 70 - 99 mg/dL   ?Glucose, capillary     Status: Abnormal  ? Collection Time: 11/06/21 12:48 AM  ?Result Value Ref Range  ? Glucose-Capillary 147 (H) 70 - 99 mg/dL  ?Glucose, capillary     Status: Abnormal  ? Collection Time: 11/06/21  4:21 AM  ?Result Value Ref Range  ? Glucose-Capillary 158 (H) 70 - 99 mg/dL  ?Basic metabolic panel     Status: Abnormal  ? Collection Time: 11/06/21  5:00 AM  ?Result Value Ref Range  ? Sodium 138 135 - 145 mmol/L  ? Potassium 4.2 3.5 - 5.1 mmol/L  ? Chloride 106 98 - 111 mmol/L  ? CO2 24 22 - 32 mmol/L  ? Glucose, Bld 177 (H) 70 - 99 mg/dL  ? BUN 16 8 - 23 mg/dL  ? Creatinine, Ser 0.94 0.44 - 1.00 mg/dL  ? Calcium 8.7 (L) 8.9 - 10.3 mg/dL  ? GFR, Estimated >60 >60 mL/min  ? Anion gap 8 5 - 15  ?CBC with Differential/Platelet     Status: None  ? Collection Time: 11/06/21  5:00 AM  ?Result Value Ref Range  ? WBC 5.2 4.0 - 10.5 K/uL  ? RBC 4.43 3.87 - 5.11 MIL/uL  ? Hemoglobin 12.1 12.0 - 15.0 g/dL  ? HCT 39.2 36.0 - 46.0 %  ? MCV 88.5 80.0 - 100.0 fL  ? MCH 27.3 26.0 - 34.0 pg  ? MCHC 30.9 30.0 - 36.0 g/dL  ? RDW 15.0 11.5 - 15.5 %  ? Platelets 153 150 - 400 K/uL  ? nRBC 0.0 0.0 - 0.2 %  ? Neutrophils Relative % 71 %  ? Neutro Abs 3.8 1.7 - 7.7 K/uL  ? Lymphocytes Relative 16 %  ? Lymphs Abs 0.8 0.7 - 4.0 K/uL  ? Monocytes Relative 7 %  ? Monocytes Absolute 0.3 0.1 - 1.0 K/uL  ? Eosinophils Relative 4 %  ? Eosinophils Absolute 0.2 0.0 - 0.5 K/uL  ? Basophils Relative 1 %  ? Basophils Absolute 0.0 0.0 - 0.1 K/uL  ? Immature Granulocytes 1 %  ? Abs Immature Granulocytes 0.03 0.00 - 0.07 K/uL  ?Magnesium     Status: None  ? Collection Time: 11/06/21  5:00 AM  ?Result Value Ref Range  ? Magnesium 2.1 1.7 - 2.4 mg/dL  ?Glucose, capillary     Status: Abnormal  ? Collection Time: 11/06/21  8:03 AM  ?Result Value Ref Range  ? Glucose-Capillary 147 (H) 70 - 99 mg/dL  ?Glucose, capillary     Status: Abnormal  ? Collection Time: 11/06/21 11:51 AM  ?Result Value Ref Range  ? Glucose-Capillary 201 (H) 70 - 99  mg/dL  ?  ?I have Reviewed nursing notes, Vitals, and Lab results since pt's last encounter. Pertinent lab results : see above ?I have ordered test including BMP, CBC, Mg ?I have reviewed  the last note from staff over past 24 hours ?I have discussed pt's care plan and test results with nursing staff, case manager ? ? LOS: 4 days  ? ?Dwyane Dee, MD ?[facility] ?11/06/2021, 2:33 PM ? ?

## 2021-11-06 NOTE — TOC Progression Note (Addendum)
Transition of Care (TOC) - Progression Note  ? ? ?Patient Details  ?Name: Isabella White ?MRN: [id number] ?Date of Birth: Jul 02, 1946 ? ?Transition of Care (TOC) CM/SW Contact  ?Nastassja Witkop E Khori Underberg, LCSW ?Phone Number: ?11/06/2021, 10:42 AM ? ?Clinical Narrative:   Roxanne Gates at [facility] to see if patient can come today. Per Verlon Au, they are not taking weekend admissions. Confirmed patient can come Monday. ? ? ?Expected Discharge Plan: Home w Home Health Services ?Barriers to Discharge: Continued Medical Work up ? ?Expected Discharge Plan and Services ?Expected Discharge Plan: Home w Home Health Services ?  ?Discharge Planning Services: CM Consult ?  ?Living arrangements for the past 2 months: Single Family Home ?                ?  ?  ?  ?  ?  ?  ?  ?  ?  ?  ? ? ?Social Determinants of Health (SDOH) Interventions ?  ? ?Readmission Risk Interventions ?No flowsheet data found. ? ?

## 2021-11-06 NOTE — Progress Notes (Signed)
Physical Therapy Treatment ?Patient Details ?Name: Isabella White ?MRN: [id number] ?DOB: [birth year] ?Today's Date: 11/06/2021 ? ? ?History of Present Illness AERIKA GROLL is a pleasant 76 y.o. female with medical history significant for hypertension, insulin-dependent diabetes mellitus, asthma, OSA, depression, and anxiety, now presenting to the emergency department with right ankle pain.  Patient has been seen by podiatry recently for right foot pain and has been using a cane due to this, but had otherwise been in her usual state of health when she had her right ankle "give out" today, leading to a fall and acute right ankle pain.  She denies hitting her head or suffering any other injury.  She denies any recent illness, is fully independent, and never experiences angina. Patient is s/p repair of calcaneal avulsion fx and achillies tendon rupture. NWB R LE ? ?  ?PT Comments  ? ? Pt was  long sitting in bed upon arriving. She is A and O x 4 and extremely motivated and pleasant. Requesting to get OOB to [facility] for BM. She was able to exit bed (HOB elevated) with increased time and vcs for improved technique. Sat EOB x 1 minute prior to stand pivot to [facility]. Had successful BM. RN tech assisted with hygiene care while pt was in standing prior to pt standing and pivoting to recliner. Pt very fatigued form minimal standing activity. Will return this afternoon and conitnue to progress pt per current POC. SNF still ost appropriate DC disposition due to pt's lack of assistance at home.   ?Recommendations for follow up therapy are one component of a multi-disciplinary discharge planning process, led by the attending physician.  Recommendations may be updated based on patient status, additional functional criteria and insurance authorization. ? ?Follow Up Recommendations ? Skilled nursing-short term rehab (<3 hours/day) ?  ?  ?Assistance Recommended at Discharge Intermittent Supervision/Assistance  ?Patient can return home with the  following A little help with bathing/dressing/bathroom;A lot of help with walking and/or transfers;Help with stairs or ramp for entrance ?  ?   ?   ?Precautions / Restrictions Precautions ?Precautions: Fall ?Restrictions ?Weight Bearing Restrictions: Yes ?RLE Weight Bearing: Non weight bearing  ?  ? ?Mobility ? Bed Mobility ?Overal bed mobility: Needs Assistance ?Bed Mobility: Supine to Sit ?  ?Supine to sit: Min assist ?  ?  ?General bed mobility comments: Increased time to perform with HOB elevated. min assist for safety. ?  ? ?Transfers ?Overall transfer level: Needs assistance ?Equipment used: Rolling walker (2 wheels) ?Transfers: Sit to/from Stand, Bed to chair/wheelchair/BSC ?Sit to Stand: Min assist, From elevated surface ?Stand pivot transfers: Mod assist ?  ?  ?  ?  ?General transfer comment: pt was able to stand to RW with min assist however required mod assist to safely pivot. Does do great job with mantaining NWB status throughout. C/O throbbing- increased pain with leg in dependent position for more than a few minutes. re-educated pt on importance of elevation when not up and moving. ?  ? ?Ambulation/Gait ?   ?General Gait Details: pt is NWB and is unable to safely porgress to "hopping" gait pattern. Fatigued quickly with transfers and static standing task. ? ? ?  ?Balance Overall balance assessment: Needs assistance ?Sitting-balance support: Single extremity supported, Bilateral upper extremity supported ?Sitting balance-Leahy Scale: Good ?Sitting balance - Comments: no LOB in sitting at EOB and on BSC ?  ?Standing balance support: Bilateral upper extremity supported, During functional activity, Reliant on assistive device for balance ?Standing balance-Leahy  Scale: Poor ?Standing balance comment: high fall risk mostly due to NWB restrictions and reliance on RW for support ?  ?  ?  ?Cognition Arousal/Alertness: Awake/alert ?Behavior During Therapy: [facility] for tasks assessed/performed ?Overall Cognitive  Status: Within Functional Limits for tasks assessed ?  ? General Comments: Pt is A and O x 4 and pleasnat and cooperative ?  ?  ? ?  ?   ?   ? ?Pertinent Vitals/Pain Pain Assessment ?Pain Assessment: 0-10 ?Pain Score: 4  ?Pain Location: R LE ?Pain Descriptors / Indicators: Discomfort, Throbbing ?Pain Intervention(s): Limited activity within patient's tolerance, Monitored during session, Premedicated before session, Repositioned  ? ? ? ?PT Goals (current goals can now be found in the care plan section) Acute Rehab PT Goals ?Patient Stated Goal: to go to rehab then home ?Progress towards PT goals: Progressing toward goals ? ?  ?Frequency ? ? ? BID ? ? ? ?  ?PT Plan Current plan remains appropriate  ? ? ?   ?AM-PAC PT "6 Clicks" Mobility   ?Outcome Measure ? Help needed turning from your back to your side while in a flat bed without using bedrails?: A Little ?Help needed moving from lying on your back to sitting on the side of a flat bed without using bedrails?: A Little ?Help needed moving to and from a bed to a chair (including a wheelchair)?: A Little ?Help needed standing up from a chair using your arms (e.g., wheelchair or bedside chair)?: A Little ?Help needed to walk in hospital room?: Total ?Help needed climbing 3-5 steps with a railing? : Total ?6 Click Score: 14 ? ?  ?End of Session Equipment Utilized During Treatment: Gait belt ?Activity Tolerance: Patient limited by pain;Patient limited by fatigue;Patient tolerated treatment well ?Patient left: in chair;with call bell/phone within reach;with chair alarm set ?Nurse Communication: Mobility status ?PT Visit Diagnosis: Unsteadiness on feet (R26.81);Muscle weakness (generalized) (M62.81);Other abnormalities of gait and mobility (R26.89);History of falling (Z91.81) ?Pain - Right/Left: Right ?Pain - part of body: Ankle and joints of foot ?  ? ? ?Time: 6160-7371 ?PT Time Calculation (min) (ACUTE ONLY): 27 min ? ?Charges:  $Therapeutic Activity: 23-37 mins           ?          ?Jetta Lout PTA ?11/06/21, 12:12 PM  ? ?

## 2021-11-07 DIAGNOSIS — S92031A Displaced avulsion fracture of tuberosity of right calcaneus, initial encounter for closed fracture: Secondary | ICD-10-CM | POA: Diagnosis not present

## 2021-11-07 LAB — RESP PANEL BY RT-PCR (FLU A&B, COVID) ARPGX2
Influenza A by PCR: NEGATIVE
Influenza B by PCR: NEGATIVE
SARS Coronavirus 2 by RT PCR: NEGATIVE

## 2021-11-07 LAB — GLUCOSE, CAPILLARY
Glucose-Capillary: 122 mg/dL — ABNORMAL HIGH (ref 70–99)
Glucose-Capillary: 137 mg/dL — ABNORMAL HIGH (ref 70–99)
Glucose-Capillary: 160 mg/dL — ABNORMAL HIGH (ref 70–99)
Glucose-Capillary: 163 mg/dL — ABNORMAL HIGH (ref 70–99)
Glucose-Capillary: 178 mg/dL — ABNORMAL HIGH (ref 70–99)
Glucose-Capillary: 201 mg/dL — ABNORMAL HIGH (ref 70–99)

## 2021-11-07 NOTE — Progress Notes (Signed)
Physical Therapy Treatment ?Patient Details ?Name: Isabella White ?MRN: [id number] ?DOB: 26-Jan-1946 ?Today's Date: 11/07/2021 ? ? ?History of Present Illness Isabella White is a pleasant 76 y.o. female with medical history significant for hypertension, insulin-dependent diabetes mellitus, asthma, OSA, depression, and anxiety, now presenting to the emergency department with right ankle pain.  Patient has been seen by podiatry recently for right foot pain and has been using a cane due to this, but had otherwise been in her usual state of health when she had her right ankle "give out" today, leading to a fall and acute right ankle pain.  She denies hitting her head or suffering any other injury.  She denies any recent illness, is fully independent, and never experiences angina. Patient is s/p repair of calcaneal avulsion fx and achillies tendon rupture. NWB R LE ? ?  ?PT Comments  ? ? Pt in recliner, needing to use bathroom.  Stood to walker and pivoted to/from commode at bedside with mod a x 1 to stand and min a x 1 to pivot.  Unable to release walker for own self care so assist was provided.  Large soft BM.  Participated in exercises as described below.  Stood x 3 from recliner to practice transfers and increase confidence.  Progressed from mod a x 1 initially to min guard/assist at end of session.   ?  ?Recommendations for follow up therapy are one component of a multi-disciplinary discharge planning process, led by the attending physician.  Recommendations may be updated based on patient status, additional functional criteria and insurance authorization. ? ?Follow Up Recommendations ? Skilled nursing-short term rehab (<3 hours/day) ?  ?  ?Assistance Recommended at Discharge Intermittent Supervision/Assistance  ?Patient can return home with the following A little help with bathing/dressing/bathroom;A lot of help with walking and/or transfers;Help with stairs or ramp for entrance ?  ?Equipment Recommendations ? Rolling  walker (2 wheels);BSC/3in1;Wheelchair (measurements PT);Wheelchair cushion (measurements PT)  ?  ?Recommendations for Other Services   ? ? ?  ?Precautions / Restrictions Precautions ?Precautions: Fall ?Restrictions ?Weight Bearing Restrictions: Yes ?RLE Weight Bearing: Non weight bearing  ?  ? ?Mobility ? Bed Mobility ?  ?  ?  ?  ?  ?  ?  ?General bed mobility comments: in recliner before and after session ?  ? ?Transfers ?Overall transfer level: Needs assistance ?Equipment used: Rolling walker (2 wheels) ?Transfers: Sit to/from Stand ?Sit to Stand: Min assist, Mod assist ?Stand pivot transfers: Min assist ?  ?  ?  ?  ?  ?  ? ?Ambulation/Gait ?  ?  ?  ?  ?Gait velocity: decreased ?  ?  ?General Gait Details: to true gait, pivots on LLE but does not hop (NWB) ? ? ?Stairs ?  ?  ?  ?  ?  ? ? ?Wheelchair Mobility ?  ? ?Modified Rankin (Stroke Patients Only) ?  ? ? ?  ?Balance Overall balance assessment: Needs assistance ?Sitting-balance support: Single extremity supported, Bilateral upper extremity supported ?Sitting balance-Leahy Scale: Good ?  ?  ?Standing balance support: Bilateral upper extremity supported, During functional activity, Reliant on assistive device for balance ?Standing balance-Leahy Scale: Fair ?  ?  ?  ?  ?  ?  ?  ?  ?  ?  ?  ?  ?  ? ?  ?Cognition Arousal/Alertness: Awake/alert ?Behavior During Therapy: [facility] for tasks assessed/performed ?Overall Cognitive Status: Within Functional Limits for tasks assessed ?  ?  ?  ?  ?  ?  ?  ?  ?  ?  ?  ?  ?  ?  ?  ?  ?  ?  ?  ? ?  ?  Exercises Other Exercises ?Other Exercises: seated AROM BLE x 10, reviewed HEP - sit to stand x 5 during session. ? ?  ?General Comments   ?  ?  ? ?Pertinent Vitals/Pain Pain Assessment ?Pain Assessment: Faces ?Faces Pain Scale: Hurts a little bit ?Pain Location: R LE ?Pain Descriptors / Indicators: Discomfort, Throbbing ?Pain Intervention(s): Limited activity within patient's tolerance, Monitored during session, Premedicated before  session, Repositioned  ? ? ?Home Living   ?  ?  ?  ?  ?  ?  ?  ?  ?  ?   ?  ?Prior Function    ?  ?  ?   ? ?PT Goals (current goals can now be found in the care plan section) Progress towards PT goals: Progressing toward goals ? ?  ?Frequency ? ? ? BID ? ? ? ?  ?PT Plan Current plan remains appropriate  ? ? ?Co-evaluation   ?  ?  ?  ?  ? ?  ?AM-PAC PT "6 Clicks" Mobility   ?Outcome Measure ? Help needed turning from your back to your side while in a flat bed without using bedrails?: A Little ?Help needed moving from lying on your back to sitting on the side of a flat bed without using bedrails?: A Little ?Help needed moving to and from a bed to a chair (including a wheelchair)?: A Little ?Help needed standing up from a chair using your arms (e.g., wheelchair or bedside chair)?: A Little ?Help needed to walk in hospital room?: Total ?Help needed climbing 3-5 steps with a railing? : Total ?6 Click Score: 14 ? ?  ?End of Session Equipment Utilized During Treatment: Gait belt ?Activity Tolerance: Patient tolerated treatment well;Other (comment) ?Patient left: in chair;with call bell/phone within reach;with chair alarm set ?Nurse Communication: Mobility status ?PT Visit Diagnosis: Unsteadiness on feet (R26.81);Muscle weakness (generalized) (M62.81);Other abnormalities of gait and mobility (R26.89);History of falling (Z91.81) ?Pain - Right/Left: Right ?Pain - part of body: Ankle and joints of foot ?  ? ? ?Time: [id number] ?PT Time Calculation (min) (ACUTE ONLY): 24 min ? ?Charges:  $Therapeutic Exercise: 8-22 mins ?$Therapeutic Activity: 8-22 mins          ?          ?Chesley Noon, PTA ?11/07/21, 10:49 AM ? ?

## 2021-11-07 NOTE — Progress Notes (Signed)
?Progress Note ? ? ? ?Isabella White   ?YIF:[id number]  ?DOB: 01/20/1946  ?DOA: 11/02/2021     5 ?PCP: Lynnea Ferrier, MD ? ?Initial CC: fall ? ?Hospital Course: ?[name] is a 76 yo female with PMH HTN, IDDM, asthma, OSA, depression/anxiety who presented after a fall resulting in right ankle pain.  She had been having ongoing right foot pain and occasional sensation that her right leg would give out.  This again happened on admission and she fell.  She was visiting a family member in the ICU when she had fallen in the hallway. ?Initial x-ray showed an avulsion fracture of the posterior calcaneus at the Achilles tendon with displaced bone fragment appreciated.  This was confirmed on MRI.  She was evaluated by orthopedic surgery with recommendation for surgical repair. ? ?Interval History:  ?No events overnight.  Sitting on bedside commode this morning.  Feeling well overall.  Plan is for discharging on Monday to rehab. ? ?Assessment and Plan: ?* Avulsion fracture of right calcaneus ?- s/p mechanical fall ?- imaging shows avulsion fracture of right calcaneus with retraction of Achilles tendon from its insertion ?- s/p surgical repair on 3/15 ?- plan is for NWB to RLE and continue Prevena VAC at discharge with outpt follow up with podiatry in 1 week ?- s/p angiography on 3/17 (no critical stenosis or intervention needed) ?- Continue pain control ?-Bed accepted at [facility].  Okay to discharge on Monday ? ?Refusal of blood transfusions as patient is [employer]'s [employer] ?- Patient declines blood transfusions if needed ? ?Depression ?- Continue Zoloft ? ?Hypertension ?- Continue Norvasc ?-BP uptrending, okay to resume losartan ? ?COPD (chronic obstructive pulmonary disease) (HCC) ?- no s/s exacerbation ?- denies tobacco use ?- continue inhalers ? ?Sleep apnea ?- Denies CPAP use at home and declines CPAP in the hospital ? ?Diabetes mellitus without complication (HCC) ?- Continue SSI and CBG monitoring ? ? ?Old  records reviewed in assessment of this patient ? ?Antimicrobials: ? ? ?DVT prophylaxis:  ?SCDs Start: 11/02/21 1925 ? ? ?Code Status:   Code Status: Full Code ? ?Disposition Plan: [facility] on Monday.  Medically stable ?Status is: Inpt ? ?Objective: ?Blood pressure (!) 159/73, pulse 84, temperature 98.6 ?F (37 ?C), resp. rate 16, height 5\' 1"  (1.549 m), weight 85.3 kg, SpO2 97 %.  ?Examination:  ?Physical Exam ?Constitutional:   ?   Appearance: Normal appearance.  ?HENT:  ?   Head: Normocephalic and atraumatic.  ?   Mouth/Throat:  ?   Mouth: Mucous membranes are moist.  ?Eyes:  ?   Extraocular Movements: Extraocular movements intact.  ?Cardiovascular:  ?   Rate and Rhythm: Normal rate and regular rhythm.  ?Pulmonary:  ?   Effort: Pulmonary effort is normal.  ?   Breath sounds: Normal breath sounds.  ?Abdominal:  ?   General: Bowel sounds are normal. There is no distension.  ?   Palpations: Abdomen is soft.  ?   Tenderness: There is no abdominal tenderness.  ?Musculoskeletal:  ?   Cervical back: Normal range of motion and neck supple.  ?   Comments: Wound vac in place on right foot and large surgical dressing and bandage in place  ?Skin: ?   General: Skin is warm and dry.  ?Neurological:  ?   General: No focal deficit present.  ?   Mental Status: She is alert.  ?Psychiatric:     ?   Mood and Affect: Mood normal.     ?  Behavior: Behavior normal.  ?  ? ?Consultants:  ?Orthopedic surgery ? ?Procedures:  ?Achilles tendon avulsion repair, 3/15 ?RLE angiography, 3/17 ? ?Data Reviewed: ?Results for orders placed or performed during the hospital encounter of 11/02/21 (from the past 24 hour(s))  ?Glucose, capillary     Status: Abnormal  ? Collection Time: 11/06/21  4:20 PM  ?Result Value Ref Range  ? Glucose-Capillary 129 (H) 70 - 99 mg/dL  ? Comment 1 Notify RN   ?Glucose, capillary     Status: Abnormal  ? Collection Time: 11/06/21  9:06 PM  ?Result Value Ref Range  ? Glucose-Capillary 169 (H) 70 - 99 mg/dL   ?Glucose, capillary     Status: Abnormal  ? Collection Time: 11/07/21 12:35 AM  ?Result Value Ref Range  ? Glucose-Capillary 137 (H) 70 - 99 mg/dL  ?Glucose, capillary     Status: Abnormal  ? Collection Time: 11/07/21  4:38 AM  ?Result Value Ref Range  ? Glucose-Capillary 163 (H) 70 - 99 mg/dL  ?Glucose, capillary     Status: Abnormal  ? Collection Time: 11/07/21  9:34 AM  ?Result Value Ref Range  ? Glucose-Capillary 201 (H) 70 - 99 mg/dL  ?Glucose, capillary     Status: Abnormal  ? Collection Time: 11/07/21 12:59 PM  ?Result Value Ref Range  ? Glucose-Capillary 160 (H) 70 - 99 mg/dL  ?  ?I have Reviewed nursing notes, Vitals, and Lab results since pt's last encounter. Pertinent lab results : see above ?I have ordered test including BMP, CBC, Mg ?I have reviewed the last note from staff over past 24 hours ?I have discussed pt's care plan and test results with nursing staff, case manager ? ? LOS: 5 days  ? ?Lewie Chamber, MD ?[facility] ?11/07/2021, 1:47 PM ? ?

## 2021-11-08 DIAGNOSIS — Z794 Long term (current) use of insulin: Secondary | ICD-10-CM | POA: Diagnosis not present

## 2021-11-08 DIAGNOSIS — W19XXXD Unspecified fall, subsequent encounter: Secondary | ICD-10-CM | POA: Diagnosis not present

## 2021-11-08 DIAGNOSIS — R5381 Other malaise: Secondary | ICD-10-CM | POA: Diagnosis not present

## 2021-11-08 DIAGNOSIS — M1990 Unspecified osteoarthritis, unspecified site: Secondary | ICD-10-CM | POA: Diagnosis not present

## 2021-11-08 DIAGNOSIS — F332 Major depressive disorder, recurrent severe without psychotic features: Secondary | ICD-10-CM | POA: Diagnosis not present

## 2021-11-08 DIAGNOSIS — E538 Deficiency of other specified B group vitamins: Secondary | ICD-10-CM | POA: Diagnosis not present

## 2021-11-08 DIAGNOSIS — M9261 Juvenile osteochondrosis of tarsus, right ankle: Secondary | ICD-10-CM | POA: Diagnosis not present

## 2021-11-08 DIAGNOSIS — S92035D Nondisplaced avulsion fracture of tuberosity of left calcaneus, subsequent encounter for fracture with routine healing: Secondary | ICD-10-CM | POA: Diagnosis not present

## 2021-11-08 DIAGNOSIS — Z531 Procedure and treatment not carried out because of patient's decision for reasons of belief and group pressure: Secondary | ICD-10-CM | POA: Diagnosis not present

## 2021-11-08 DIAGNOSIS — K5900 Constipation, unspecified: Secondary | ICD-10-CM | POA: Diagnosis not present

## 2021-11-08 DIAGNOSIS — S92034D Nondisplaced avulsion fracture of tuberosity of right calcaneus, subsequent encounter for fracture with routine healing: Secondary | ICD-10-CM | POA: Diagnosis not present

## 2021-11-08 DIAGNOSIS — M6788 Other specified disorders of synovium and tendon, other site: Secondary | ICD-10-CM | POA: Diagnosis not present

## 2021-11-08 DIAGNOSIS — I10 Essential (primary) hypertension: Secondary | ICD-10-CM | POA: Diagnosis not present

## 2021-11-08 DIAGNOSIS — J449 Chronic obstructive pulmonary disease, unspecified: Secondary | ICD-10-CM | POA: Diagnosis not present

## 2021-11-08 DIAGNOSIS — T466X5A Adverse effect of antihyperlipidemic and antiarteriosclerotic drugs, initial encounter: Secondary | ICD-10-CM | POA: Diagnosis not present

## 2021-11-08 DIAGNOSIS — K219 Gastro-esophageal reflux disease without esophagitis: Secondary | ICD-10-CM | POA: Diagnosis not present

## 2021-11-08 DIAGNOSIS — G4733 Obstructive sleep apnea (adult) (pediatric): Secondary | ICD-10-CM | POA: Diagnosis not present

## 2021-11-08 DIAGNOSIS — F418 Other specified anxiety disorders: Secondary | ICD-10-CM | POA: Diagnosis not present

## 2021-11-08 DIAGNOSIS — Z7401 Bed confinement status: Secondary | ICD-10-CM | POA: Diagnosis not present

## 2021-11-08 DIAGNOSIS — Z7984 Long term (current) use of oral hypoglycemic drugs: Secondary | ICD-10-CM | POA: Diagnosis not present

## 2021-11-08 DIAGNOSIS — M791 Myalgia, unspecified site: Secondary | ICD-10-CM | POA: Diagnosis not present

## 2021-11-08 DIAGNOSIS — R531 Weakness: Secondary | ICD-10-CM | POA: Diagnosis not present

## 2021-11-08 DIAGNOSIS — S92031A Displaced avulsion fracture of tuberosity of right calcaneus, initial encounter for closed fracture: Secondary | ICD-10-CM | POA: Diagnosis not present

## 2021-11-08 DIAGNOSIS — E1169 Type 2 diabetes mellitus with other specified complication: Secondary | ICD-10-CM | POA: Diagnosis not present

## 2021-11-08 DIAGNOSIS — M67873 Other specified disorders of tendon, right ankle and foot: Secondary | ICD-10-CM | POA: Diagnosis not present

## 2021-11-08 DIAGNOSIS — F3342 Major depressive disorder, recurrent, in full remission: Secondary | ICD-10-CM | POA: Diagnosis not present

## 2021-11-08 DIAGNOSIS — S92034A Nondisplaced avulsion fracture of tuberosity of right calcaneus, initial encounter for closed fracture: Secondary | ICD-10-CM | POA: Diagnosis not present

## 2021-11-08 DIAGNOSIS — J45909 Unspecified asthma, uncomplicated: Secondary | ICD-10-CM | POA: Diagnosis not present

## 2021-11-08 DIAGNOSIS — E119 Type 2 diabetes mellitus without complications: Secondary | ICD-10-CM | POA: Diagnosis not present

## 2021-11-08 DIAGNOSIS — E7800 Pure hypercholesterolemia, unspecified: Secondary | ICD-10-CM | POA: Diagnosis not present

## 2021-11-08 LAB — GLUCOSE, CAPILLARY
Glucose-Capillary: 141 mg/dL — ABNORMAL HIGH (ref 70–99)
Glucose-Capillary: 154 mg/dL — ABNORMAL HIGH (ref 70–99)
Glucose-Capillary: 169 mg/dL — ABNORMAL HIGH (ref 70–99)
Glucose-Capillary: 191 mg/dL — ABNORMAL HIGH (ref 70–99)

## 2021-11-08 MED ORDER — OXYCODONE HCL 5 MG PO TABS: 5.0000 mg | ORAL_TABLET | ORAL | 0 refills | Status: AC | PRN

## 2021-11-08 MED ORDER — ACETAMINOPHEN 325 MG PO TABS: 650.0000 mg | ORAL_TABLET | ORAL | Status: AC | PRN

## 2021-11-08 MED ORDER — SENNOSIDES-DOCUSATE SODIUM 8.6-50 MG PO TABS: 1.0000 | ORAL_TABLET | Freq: Two times a day (BID) | ORAL | Status: AC

## 2021-11-08 MED ORDER — POLYETHYLENE GLYCOL 3350 17 G PO PACK: 17.0000 g | PACK | Freq: Every day | ORAL | 0 refills | Status: AC

## 2021-11-08 MED ORDER — METOPROLOL SUCCINATE ER 25 MG PO TB24: 25.0000 mg | ORAL_TABLET | Freq: Two times a day (BID) | ORAL | Status: AC

## 2021-11-08 MED ORDER — LEVEMIR FLEXTOUCH 100 UNIT/ML ~~LOC~~ SOPN: 15.0000 [IU] | PEN_INJECTOR | Freq: Every day | SUBCUTANEOUS | 11 refills | Status: AC

## 2021-11-08 NOTE — Progress Notes (Signed)
Physical Therapy Treatment ?Patient Details ?Name: Isabella White ?MRN: :[id number] ?DOB: 1946-02-24 ?Today's Date: 11/08/2021 ? ? ?History of Present Illness Isabella White is a pleasant 76 y.o. female with medical history significant for hypertension, insulin-dependent diabetes mellitus, asthma, OSA, depression, and anxiety, now presenting to the emergency department with right ankle pain.  Patient has been seen by podiatry recently for right foot pain and has been using a cane due to this, but had otherwise been in her usual state of health when she had her right ankle "give out" today, leading to a fall and acute right ankle pain.  She denies hitting her head or suffering any other injury.  She denies any recent illness, is fully independent, and never experiences angina. Patient is s/p repair of calcaneal avulsion fx and achillies tendon rupture. NWB R LE ? ?  ?PT Comments  ? ? To EOB with rails and min guard.  Stood and transferred to commode with min a x 1.  Assist for care as she remains unable to let to of walker for self care.  Transfers 180 degrees to recliner.  After rest, she stands x 2 with RW at chairside up to 1 minute each.   ?Overall confidence with mobility is increasing daily.  SNF remains appropriate for transition as she is unable to care for herself at home alone at this time but is motivated to return home ASAP. ?  ?Recommendations for follow up therapy are one component of a multi-disciplinary discharge planning process, led by the attending physician.  Recommendations may be updated based on patient status, additional functional criteria and insurance authorization. ? ?Follow Up Recommendations ? Skilled nursing-short term rehab (<3 hours/day) ?  ?  ?Assistance Recommended at Discharge Intermittent Supervision/Assistance  ?Patient can return home with the following A little help with bathing/dressing/bathroom;A lot of help with walking and/or transfers;Help with stairs or ramp for entrance ?   ?Equipment Recommendations ? Rolling walker (2 wheels);BSC/3in1;Wheelchair (measurements PT);Wheelchair cushion (measurements PT)  ?  ?Recommendations for Other Services   ? ? ?  ?Precautions / Restrictions Precautions ?Precautions: Fall ?Restrictions ?Weight Bearing Restrictions: Yes ?RLE Weight Bearing: Non weight bearing  ?  ? ?Mobility ? Bed Mobility ?Overal bed mobility: Needs Assistance ?Bed Mobility: Supine to Sit ?  ?  ?Supine to sit: Min guard ?  ?  ?  ?  ? ?Transfers ?Overall transfer level: Needs assistance ?Equipment used: Rolling walker (2 wheels) ?Transfers: Sit to/from Stand ?Sit to Stand: Min assist ?  ?  ?  ?  ?  ?  ?  ? ?Ambulation/Gait ?  ?  ?  ?  ?Gait velocity: decreased ?  ?  ?General Gait Details: to true gait, pivots on LLE but does not hop (NWB) ? ? ?Stairs ?  ?  ?  ?  ?  ? ? ?Wheelchair Mobility ?  ? ?Modified Rankin (Stroke Patients Only) ?  ? ? ?  ?Balance Overall balance assessment: Needs assistance ?Sitting-balance support: Single extremity supported, Bilateral upper extremity supported ?Sitting balance-Leahy Scale: Good ?Sitting balance - Comments: no LOB in sitting at EOB and on BSC ?  ?Standing balance support: Bilateral upper extremity supported, During functional activity, Reliant on assistive device for balance ?Standing balance-Leahy Scale: Fair ?Standing balance comment: high fall risk mostly due to NWB restrictions and reliance on RW for support ?  ?  ?  ?  ?  ?  ?  ?  ?  ?  ?  ?  ? ?  ?  Cognition Arousal/Alertness: Awake/alert ?Behavior During Therapy: [facility] for tasks assessed/performed ?Overall Cognitive Status: Within Functional Limits for tasks assessed ?  ?  ?  ?  ?  ?  ?  ?  ?  ?  ?  ?  ?  ?  ?  ?  ?  ?  ?  ? ?  ?Exercises Other Exercises ?Other Exercises: stood an additional x 2 after commode for up to 1 minute each ? ?  ?General Comments   ?  ?  ? ?Pertinent Vitals/Pain Pain Assessment ?Pain Assessment: 0-10 ?Pain Score: 3  ?Pain Location: R LE ?Pain Descriptors /  Indicators: Discomfort, Throbbing ?Pain Intervention(s): Limited activity within patient's tolerance, Monitored during session, Premedicated before session, Repositioned  ? ? ?Home Living   ?  ?  ?  ?  ?  ?  ?  ?  ?  ?   ?  ?Prior Function    ?  ?  ?   ? ?PT Goals (current goals can now be found in the care plan section) Progress towards PT goals: Progressing toward goals ? ?  ?Frequency ? ? ? BID ? ? ? ?  ?PT Plan Current plan remains appropriate  ? ? ?Co-evaluation   ?  ?  ?  ?  ? ?  ?AM-PAC PT "6 Clicks" Mobility   ?Outcome Measure ? Help needed turning from your back to your side while in a flat bed without using bedrails?: A Little ?Help needed moving from lying on your back to sitting on the side of a flat bed without using bedrails?: A Little ?Help needed moving to and from a bed to a chair (including a wheelchair)?: A Little ?Help needed standing up from a chair using your arms (e.g., wheelchair or bedside chair)?: A Little ?Help needed to walk in hospital room?: Total ?Help needed climbing 3-5 steps with a railing? : Total ?6 Click Score: 14 ? ?  ?End of Session Equipment Utilized During Treatment: Gait belt ?Activity Tolerance: Patient tolerated treatment well;Other (comment) ?Patient left: in chair;with call bell/phone within reach;with chair alarm set ?Nurse Communication: Mobility status ?PT Visit Diagnosis: Unsteadiness on feet (R26.81);Muscle weakness (generalized) (M62.81);Other abnormalities of gait and mobility (R26.89);History of falling (Z91.81) ?Pain - Right/Left: Right ?Pain - part of body: Ankle and joints of foot ?  ? ? ?Time: [id number] ?PT Time Calculation (min) (ACUTE ONLY): 29 min ? ?Charges:  $Therapeutic Exercise: 23-37 mins          ?         Chesley Noon, PTA ?11/08/21, 10:45 AM ? ?

## 2021-11-08 NOTE — Progress Notes (Signed)
?  Subjective:  ?Patient ID: Isabella White, female    DOB: Jan 14, 1946,  MRN: [id number] ? ?Seen at bedside resting comfortably she says is not having much pain just some occasional throbbing at worst a 4 out of 10 ? ?Negative for chest pain and shortness of breath ?Chest pain: no ?Shortness of breath: no ?Fever: no ?Night sweats: no ?Review of all other systems is negative ?Objective:  ? ?Vitals:  ? 11/08/21 0406 11/08/21 3825  ?BP: (!) 161/82 (!) 145/59  ?Pulse: 88 80  ?Resp: 16 16  ?Temp: 98 ?F (36.7 ?C) 97.9 ?F (36.6 ?C)  ?SpO2: 98% 94%  ? ?General AA&O x3. Normal mood and affect.  ?Vascular Dorsalis pedis and posterior tibial pulses 2/4 bilat. ?Brisk capillary refill to all digits. Pedal hair present.  ?Neurologic Epicritic sensation grossly intact.  ?Dermatologic Dressing is clean dry and intact back is on operating with approximately 50 cc of serosanguineous drainage  ?Orthopedic: MMT 5/5 lower extremities  ? ? ?Assessment & Plan:  ?Patient was evaluated and treated and all questions answered. ? ?POD #1, repair of Achilles avulsion rupture, FHL transfer, Haglund's deformity resection, gastrocnemius recession ?-Overall doing quite well not having much pain ?-Being discharged to rehab today ?-Home Prevena unit was attached to VAC sponge and maintaining pressure ?-NWB RLE ?-Follow-up scheduled with me Wednesday 3/22 at 1:15 PM ? ?Edwin Cap, DPM ? ?Accessible via secure chat for questions or concerns. ? ?

## 2021-11-08 NOTE — Progress Notes (Signed)
Blood pressure (!) 145/59, pulse 80, temperature 97.9 ?F (36.6 ?C), resp. rate 16, height 5\' 1"  (1.549 m), weight 85.3 kg, SpO2 94 %. ?Iv removed site c/d/I, pt transported via EMS, attempted to call , nurse never answered, pt transport via ems to [facility] with all belongings.  ?

## 2021-11-08 NOTE — Discharge Summary (Signed)
?Physician Discharge Summary ?  ?Patient: Isabella White MRN: [id number] DOB: [birth year]  ?Admit date:     11/02/2021  ?Discharge date: 11/08/21  ?Discharge Physician: Isabella White  ? ?PCP: Isabella Ferrier, MD  ? ?Recommendations at discharge:  ? ?Follow-up with podiatry on 11/10/2021 ?Adjust insulin regimen further, Levemir dose was decreased from home regimen at discharge ? ?Discharge Diagnoses: ?Principal Problem: ?  Avulsion fracture of right calcaneus ?Active Problems: ?  Diabetes mellitus without complication (HCC) ?  Sleep apnea ?  COPD (chronic obstructive pulmonary disease) (HCC) ?  Hypertension ?  Depression ?  Achilles tendinosis of right lower extremity ?  Chronic rupture of Achilles tendon ?  Haglund's deformity of right heel ?  Gastrocnemius equinus, right ?  Refusal of blood transfusions as patient is Jehovah's Witness ? ?Resolved Problems: ?  * No resolved hospital problems. * ? ?Hospital Course: ?[name] is a 76 yo female with PMH HTN, IDDM, asthma, OSA, depression/anxiety who presented after a fall resulting in right ankle pain.  She had been having ongoing right foot pain and occasional sensation that her right leg would give out.  This again happened on admission and she fell.  She was visiting a family member in the ICU when she had fallen in the hallway. ?Initial x-ray showed an avulsion fracture of the posterior calcaneus at the Achilles tendon with displaced bone fragment appreciated.  This was confirmed on MRI.  She was evaluated by orthopedic surgery with recommendation for surgical repair. ?See below for further problem-based plan. ? ?Assessment and Plan: ?* Avulsion fracture of right calcaneus ?- s/p mechanical fall ?- imaging shows avulsion fracture of right calcaneus with retraction of Achilles tendon from its insertion ?- s/p surgical repair on 3/15 ?- plan is for NWB to RLE and continue Prevena VAC at discharge with outpt follow up with podiatry in 1 week ?- s/p angiography on  3/17 (no critical stenosis or intervention needed) ?- Continue pain control ?-Bed accepted at [facility].  Okay to discharge on Monday ? ?Diabetes mellitus without complication (HCC) ?- A1c 6.7% ?- Home Levemir reduced from 25 units down to 15 units at time of discharge.  May need further adjustment ?- Continue carb consistent diet ? ?Refusal of blood transfusions as patient is Jehovah's Witness ?- Patient declines blood transfusions if needed ? ?Depression ?- Continue Zoloft ? ?Hypertension ?- Continue losartan and Norvasc ? ?COPD (chronic obstructive pulmonary disease) (HCC) ?- no s/s exacerbation ?- denies tobacco use ?- continue inhalers ? ?Sleep apnea ?- Denies CPAP use at home and declines CPAP in the hospital ? ? ? ?  ? ? ?Consultants: Podiatry, vascular surgery ?Procedures performed:  ?Achilles tendon avulsion repair, 3/15 ?RLE angiography, 3/17 ?Disposition: Skilled nursing facility ?Diet recommendation:  ?Discharge Diet Orders (From admission, onward)  ? ?  Start     Ordered  ? 11/08/21 0000  Diet - low sodium heart healthy       ? 11/08/21 1229  ? 11/08/21 0000  Diet Carb Modified       ? 11/08/21 1229  ? ?  ?  ? ?  ? ?Carb modified diet ?DISCHARGE MEDICATION: ?Allergies as of 11/08/2021   ? ?   Reactions  ? Atorvastatin   ? Other reaction(s): Muscle Pain  ? Canagliflozin Diarrhea  ? Empagliflozin   ? Other reaction(s): Abdominal Pain  ? Etodolac Other (See Comments)  ? Intolerant, Jittery and Tachycardia  ? Lovastatin   ? Other reaction(s): Unknown  ?  Sulfa Antibiotics   ? Trazodone   ? Other reaction(s): Other (See Comments) ?Worsened insomnia  ? Valium [diazepam]   ? Penicillins Rash  ? ?  ? ?  ?Medication List  ?  ? ?STOP taking these medications   ? ?HYDROcodone-acetaminophen 5-325 MG tablet ?Commonly known as: Norco ?  ?Pfizer COVID-19 Vac Bivalent injection ?Generic drug: COVID-19 mRNA bivalent vaccine [employer](Pfizer) ?  ?Pfizer-BioNT COVID-19 Vac-TriS Susp injection ?Generic drug: COVID-19 mRNA  Vac-TriS [employer](Pfizer) ?  ? ?  ? ?TAKE these medications   ? ?acetaminophen 325 MG tablet ?Commonly known as: TYLENOL ?Take 2 tablets (650 mg total) by mouth every 4 (four) hours as needed for headache or mild pain. ?  ?albuterol 108 (90 Base) MCG/ACT inhaler ?Commonly known as: VENTOLIN HFA ?Inhale into the lungs every 6 (six) hours as needed for wheezing or shortness of breath. ?  ?amLODipine 5 MG tablet ?Commonly known as: NORVASC ?Take 5 mg by mouth daily. ?  ?budesonide-formoterol 160-4.5 MCG/ACT inhaler ?Commonly known as: SYMBICORT ?Inhale 2 puffs into the lungs 2 (two) times daily. ?  ?ferrous sulfate 325 (65 FE) MG tablet ?Take 325 mg by mouth daily with breakfast. ?  ?hydrochlorothiazide 25 MG tablet ?Commonly known as: HYDRODIURIL ?Take 25 mg by mouth daily. ?  ?Levemir FlexTouch 100 UNIT/ML FlexPen ?Generic drug: insulin detemir ?Inject 15 Units into the skin at bedtime. ?What changed: how much to take ?  ?losartan 100 MG tablet ?Commonly known as: COZAAR ?Take 100 mg by mouth daily. ?  ?metFORMIN 500 MG 24 hr tablet ?Commonly known as: GLUCOPHAGE-XR ?Take 1,000 mg by mouth at bedtime. ?  ?metoprolol succinate 25 MG 24 hr tablet ?Commonly known as: TOPROL-XL ?Take 1 tablet (25 mg total) by mouth 2 (two) times daily. ?What changed:  ?medication strength ?how much to take ?  ?montelukast 10 MG tablet ?Commonly known as: SINGULAIR ?Take 10 mg by mouth at bedtime. ?  ?multivitamin tablet ?Take 1 tablet by mouth daily. ?  ?omeprazole 20 MG capsule ?Commonly known as: PRILOSEC ?Take 20 mg by mouth 2 (two) times daily as needed. ?What changed: Another medication with the same name was removed. Continue taking this medication, and follow the directions you see here. ?  ?oxyCODONE 5 MG immediate release tablet ?Commonly known as: Oxy IR/ROXICODONE ?Take 1 tablet (5 mg total) by mouth every 4 (four) hours as needed for moderate pain. ?  ?Ozempic (0.25 or 0.5 MG/DOSE) 2 MG/1.5ML Sopn ?Generic drug: Semaglutide(0.25 or  0.5MG /DOS) ?Inject 0.5 mg into the skin once a week. Sunday ?  ?polyethylene glycol 17 g packet ?Commonly known as: MIRALAX / GLYCOLAX ?Take 17 g by mouth daily. ?Start taking on: November 09, 2021 ?  ?senna-docusate 8.6-50 MG tablet ?Commonly known as: Senokot-S ?Take 1 tablet by mouth 2 (two) times daily. ?  ?sertraline 100 MG tablet ?Commonly known as: ZOLOFT ?Take 100 mg by mouth daily. ?  ?vitamin B-12 1000 MCG tablet ?Commonly known as: CYANOCOBALAMIN ?Take 1,000 mcg by mouth daily. ?What changed: Another medication with the same name was removed. Continue taking this medication, and follow the directions you see here. ?  ? ?  ? ?  ?  ? ? ?  ?Discharge Care Instructions  ?(From admission, onward)  ?  ? ? ?  ? ?  Start     Ordered  ? 11/08/21 0000  Discharge wound care:       ?Comments: Continue wound VAC  ? 11/08/21 1229  ? ?  ?  ? ?  ? ?  Contact information for follow-up providers   ? ? Edwin Cap, DPM. Go on 11/10/2021.   ?Specialty: Podiatry ?Why: Post Op Visit ?1:15PM, arrive at 1:00PM ?Contact information: ?[address] ?[place] [address] ?[phone] ? ? ?  ?  ? ?  ?  ? ? Contact information for after-discharge care   ? ? Destination   ? ? [facility] .   ?Service: Skilled Nursing ?Contact information: ?[address] ?[place] [address] ?[phone] ? ?  ?  ? ?  ?  ? ?  ?  ? ?  ? ?Discharge Exam: ?Ceasar Mons Weights  ? 11/02/21 1633  ?Weight: 85.3 kg  ? ?Physical Exam ?Constitutional:   ?   Appearance: Normal appearance.  ?HENT:  ?   Head: Normocephalic and atraumatic.  ?   Mouth/Throat:  ?   Mouth: Mucous membranes are moist.  ?Eyes:  ?   Extraocular Movements: Extraocular movements intact.  ?Cardiovascular:  ?   Rate and Rhythm: Normal rate and regular rhythm.  ?Pulmonary:  ?   Effort: Pulmonary effort is normal.  ?   Breath sounds: Normal breath sounds.  ?Abdominal:  ?   General: Bowel  sounds are normal. There is no distension.  ?   Palpations: Abdomen is soft.  ?   Tenderness: There is no abdominal tenderness.  ?Musculoskeletal:  ?   Cervical back: Normal range of motion and neck supple.  ?

## 2021-11-08 NOTE — TOC Progression Note (Signed)
Transition of Care (TOC) - Progression Note  ? ? ?Patient Details  ?Name: Isabella White ?MRN: [id number] ?Date of Birth: 10-Oct-1945 ? ?Transition of Care (TOC) CM/SW Contact  ?Marlowe Sax, RN ?Phone Number: ?11/08/2021, 12:38 PM ? ?Clinical Narrative:    ?The patient to go to [facility] today with EMS transport to room 511, she will notify her family, EMS called ? ? ?Expected Discharge Plan: Home w Home Health Services ?Barriers to Discharge: Continued Medical Work up ? ?Expected Discharge Plan and Services ?Expected Discharge Plan: Home w Home Health Services ?  ?Discharge Planning Services: CM Consult ?  ?Living arrangements for the past 2 months: Single Family Home ?Expected Discharge Date: 11/08/21               ?  ?  ?  ?  ?  ?  ?  ?  ?  ?  ? ? ?Social Determinants of Health (SDOH) Interventions ?  ? ?Readmission Risk Interventions ?No flowsheet data found. ? ?

## 2021-11-08 NOTE — Care Management Important Message (Signed)
Important Message ? ?Patient Details  ?Name: Isabella White ?MRN: [id number] ?Date of Birth: 1946/08/10 ? ? ?Medicare Important Message Given:  Yes ? ? ? ? ?Olegario Messier A [name] ?11/08/2021, 12:52 PM ?

## 2021-11-08 NOTE — Discharge Instructions (Signed)
Offload heels at all times with pillows under the calf ?Nonweightbearing right lower extremity ?Do not change dressing or VAC unless there is an error message or beeping from the unit.  If this occurs call my office at [phone] ?

## 2021-11-09 DIAGNOSIS — Z531 Procedure and treatment not carried out because of patient's decision for reasons of belief and group pressure: Secondary | ICD-10-CM | POA: Diagnosis not present

## 2021-11-09 DIAGNOSIS — I10 Essential (primary) hypertension: Secondary | ICD-10-CM | POA: Diagnosis not present

## 2021-11-09 DIAGNOSIS — S92034A Nondisplaced avulsion fracture of tuberosity of right calcaneus, initial encounter for closed fracture: Secondary | ICD-10-CM | POA: Diagnosis not present

## 2021-11-09 DIAGNOSIS — G4733 Obstructive sleep apnea (adult) (pediatric): Secondary | ICD-10-CM | POA: Diagnosis not present

## 2021-11-09 DIAGNOSIS — M791 Myalgia, unspecified site: Secondary | ICD-10-CM | POA: Diagnosis not present

## 2021-11-09 DIAGNOSIS — T466X5A Adverse effect of antihyperlipidemic and antiarteriosclerotic drugs, initial encounter: Secondary | ICD-10-CM | POA: Diagnosis not present

## 2021-11-09 DIAGNOSIS — F3342 Major depressive disorder, recurrent, in full remission: Secondary | ICD-10-CM | POA: Diagnosis not present

## 2021-11-09 DIAGNOSIS — E1169 Type 2 diabetes mellitus with other specified complication: Secondary | ICD-10-CM | POA: Diagnosis not present

## 2021-11-10 ENCOUNTER — Ambulatory Visit (INDEPENDENT_AMBULATORY_CARE_PROVIDER_SITE_OTHER): Payer: PPO | Admitting: Podiatry

## 2021-11-10 ENCOUNTER — Ambulatory Visit (INDEPENDENT_AMBULATORY_CARE_PROVIDER_SITE_OTHER): Payer: PPO

## 2021-11-10 ENCOUNTER — Other Ambulatory Visit: Payer: Self-pay

## 2021-11-10 DIAGNOSIS — M6788 Other specified disorders of synovium and tendon, other site: Secondary | ICD-10-CM

## 2021-11-10 DIAGNOSIS — M67873 Other specified disorders of tendon, right ankle and foot: Secondary | ICD-10-CM | POA: Diagnosis not present

## 2021-11-10 DIAGNOSIS — M9261 Juvenile osteochondrosis of tarsus, right ankle: Secondary | ICD-10-CM

## 2021-11-10 DIAGNOSIS — M21861 Other specified acquired deformities of right lower leg: Secondary | ICD-10-CM

## 2021-11-10 DIAGNOSIS — S92031S Displaced avulsion fracture of tuberosity of right calcaneus, sequela: Secondary | ICD-10-CM

## 2021-11-10 NOTE — Patient Instructions (Signed)
Nursing instructions: ?-No weight on right leg  ?- Do not put any pressure on heels while in bed, keep floated in air with pillow under calves. Heels should be completely offloaded.  ?-Cast should remain clean and dry ?-Return in 2 weeks for suture removal and cast change  ?

## 2021-11-11 MED ORDER — RIVAROXABAN 10 MG PO TABS: 10.0000 mg | ORAL_TABLET | Freq: Every day | ORAL | 0 refills | Status: AC

## 2021-11-11 NOTE — Progress Notes (Signed)
?  Subjective:  ?Patient ID: Isabella White, female    DOB: July 26, 1946,  MRN: [id number] ? ?Chief Complaint  ?Patient presents with  ? Routine Post Op  ?    POV #1 DOS 11/03/21 Achilles rupture, [facility] transfer, gastroc  ? ? ? ?76 y.o. female returns for post-op check.  Presents today for first postoperative visit overall she is doing well not having much pain she is in assisted living ? ?Review of Systems: Negative except as noted in the HPI. Denies N/V/F/Ch. ? ? ?Objective:  ?There were no vitals filed for this visit. ?There is no height or weight on file to calculate BMI. ?Constitutional Well developed. ?Well nourished.  ?Vascular Foot warm and well perfused. ?Capillary refill normal to all digits.  Calf is soft and supple, no posterior calf or knee pain, negative Homans' sign  ?Neurologic Normal speech. ?Oriented to person, place, and time. ?Epicritic sensation to light touch grossly present bilaterally.  ?Dermatologic Skin healing well without signs of infection. Skin edges well coapted without signs of infection.  There is some ecchymosis in the proximal portions of the incision.  Small area of bleeding.  ?Orthopedic: Tenderness to palpation noted about the surgical site.  ? ?Multiple view plain film radiographs: Interval resection of avulsion fracture fragment ?Assessment:  ? ?1. Achilles tendinosis of right lower extremity   ?2. Closed displaced avulsion fracture of tuberosity of right calcaneus, sequela   ?3. Gastrocnemius equinus of right lower extremity   ?4. Haglund's deformity of right heel   ? ?Plan:  ?Patient was evaluated and treated and all questions answered. ? ?S/p foot surgery right ?-Progressing as expected post-operatively. ?-XR: Noted above ?-WB Status: NWB in short leg fiberglass cast ?-Sutures: May remove some sutures next visit. ?-Medications: We will plan to put her on Xarelto for DVT prophylaxis ?-Foot redressed.  Well-padded below the knee fiberglass cast was applied ? ?Return in about 2 weeks  (around 11/24/2021) for cast change; suture removal, post op (no x-rays).  ?

## 2021-11-11 NOTE — Addendum Note (Signed)
Addended byLilian Kapur, Jesusmanuel Erbes R on: 11/11/2021 09:20 AM ? ? Modules accepted: Orders ? ?

## 2021-11-22 ENCOUNTER — Ambulatory Visit (INDEPENDENT_AMBULATORY_CARE_PROVIDER_SITE_OTHER): Payer: PPO | Admitting: Podiatry

## 2021-11-22 DIAGNOSIS — M21861 Other specified acquired deformities of right lower leg: Secondary | ICD-10-CM

## 2021-11-22 DIAGNOSIS — F332 Major depressive disorder, recurrent severe without psychotic features: Secondary | ICD-10-CM | POA: Diagnosis not present

## 2021-11-22 DIAGNOSIS — M9261 Juvenile osteochondrosis of tarsus, right ankle: Secondary | ICD-10-CM

## 2021-11-22 DIAGNOSIS — M6788 Other specified disorders of synovium and tendon, other site: Secondary | ICD-10-CM

## 2021-11-22 DIAGNOSIS — J449 Chronic obstructive pulmonary disease, unspecified: Secondary | ICD-10-CM | POA: Diagnosis not present

## 2021-11-22 DIAGNOSIS — S92035D Nondisplaced avulsion fracture of tuberosity of left calcaneus, subsequent encounter for fracture with routine healing: Secondary | ICD-10-CM | POA: Diagnosis not present

## 2021-11-22 DIAGNOSIS — I10 Essential (primary) hypertension: Secondary | ICD-10-CM | POA: Diagnosis not present

## 2021-11-22 DIAGNOSIS — E119 Type 2 diabetes mellitus without complications: Secondary | ICD-10-CM | POA: Diagnosis not present

## 2021-11-22 DIAGNOSIS — S92031S Displaced avulsion fracture of tuberosity of right calcaneus, sequela: Secondary | ICD-10-CM

## 2021-11-22 DIAGNOSIS — G4733 Obstructive sleep apnea (adult) (pediatric): Secondary | ICD-10-CM | POA: Diagnosis not present

## 2021-11-27 ENCOUNTER — Encounter: Payer: Self-pay | Admitting: Podiatry

## 2021-11-27 NOTE — Progress Notes (Signed)
?  Subjective:  ?Patient ID: Isabella White, female    DOB: 1946-03-13,  MRN: [id number] ? ?Chief Complaint  ?Patient presents with  ? Routine Post Op  ?     POV #2 DOS 11/03/21 Achilles rupture, [facility] transfer, gastroc / CAST CHANGE & SUTURE REMOVAL  ? ? ? ?76 y.o. female returns for post-op check.  Doing well and is not having much pain.  Has been in the cast and has not put weight on it ? ?Review of Systems: Negative except as noted in the HPI. Denies N/V/F/Ch. ? ? ?Objective:  ?There were no vitals filed for this visit. ?There is no height or weight on file to calculate BMI. ?Constitutional Well developed. ?Well nourished.  ?Vascular Foot warm and well perfused. ?Capillary refill normal to all digits.  Calf is soft and supple, no posterior calf or knee pain, negative Homans' sign  ?Neurologic Normal speech. ?Oriented to person, place, and time. ?Epicritic sensation to light touch grossly present bilaterally.  ?Dermatologic Skin healing well without signs of infection. Skin edges well coapted without signs of infection.  Ecchymosis and edema has improved  ?Orthopedic: Tenderness to palpation noted about the surgical site.  ? ?Multiple view plain film radiographs: Interval resection of avulsion fracture fragment ?Assessment:  ? ?1. Achilles tendinosis of right lower extremity   ?2. Closed displaced avulsion fracture of tuberosity of right calcaneus, sequela   ?3. Gastrocnemius equinus of right lower extremity   ?4. Haglund's deformity of right heel   ? ?Plan:  ?Patient was evaluated and treated and all questions answered. ? ?S/p foot surgery right ?-Cast was removed today some of the sutures were removed about half of her left intact.  We will remove the rest the next visit.  A well padded below the knee cast was applied again today.  I will see her back in 3 weeks for follow-up. ? ?Return in about 3 weeks (around 12/13/2021) for cast change, suture removal, move to boot .  ?

## 2021-12-15 ENCOUNTER — Ambulatory Visit (INDEPENDENT_AMBULATORY_CARE_PROVIDER_SITE_OTHER): Payer: PPO | Admitting: Podiatry

## 2021-12-15 DIAGNOSIS — M9261 Juvenile osteochondrosis of tarsus, right ankle: Secondary | ICD-10-CM

## 2021-12-15 DIAGNOSIS — M21861 Other specified acquired deformities of right lower leg: Secondary | ICD-10-CM

## 2021-12-15 DIAGNOSIS — M6788 Other specified disorders of synovium and tendon, other site: Secondary | ICD-10-CM | POA: Diagnosis not present

## 2021-12-15 DIAGNOSIS — S92031S Displaced avulsion fracture of tuberosity of right calcaneus, sequela: Secondary | ICD-10-CM

## 2021-12-15 MED ORDER — MUPIROCIN 2 % EX OINT: 1.0000 "application " | TOPICAL_OINTMENT | Freq: Every day | CUTANEOUS | 0 refills | Status: AC

## 2021-12-15 NOTE — Patient Instructions (Signed)
Look for an "EvenUp" shoe attachment on [employer] or at [employer]. This will level out your hips while you are walking in the CAM boot. Wear this on the other foot around a supportive sneaker:     

## 2021-12-19 NOTE — Progress Notes (Signed)
?  Subjective:  ?Patient ID: Isabella White, female    DOB: 1946/03/09,  MRN: [id number] ? ?Chief Complaint  ?Patient presents with  ? Routine Post Op  ?   post op-for cast change, suture removal, move to boot .  ? ? ? ?76 y.o. female returns for post-op check.  She continues to do well and has not had much pain ? ?Review of Systems: Negative except as noted in the HPI. Denies N/V/F/Ch. ? ? ?Objective:  ?There were no vitals filed for this visit. ?There is no height or weight on file to calculate BMI. ?Constitutional Well developed. ?Well nourished.  ?Vascular Foot warm and well perfused. ?Capillary refill normal to all digits.  Calf is soft and supple, no posterior calf or knee pain, negative Homans' sign  ?Neurologic Normal speech. ?Oriented to person, place, and time. ?Epicritic sensation to light touch grossly present bilaterally.  ?Dermatologic Skin healing well without signs of infection. Skin edges well coapted without signs of infection.  Some areas of delayed healing but no dehiscence  ?Orthopedic: There is minimal tenderness to palpation noted about the surgical site.  Tendon continuity maintained she has good strength  ? ?Multiple view plain film radiographs: Interval resection of avulsion fracture fragment ?Assessment:  ? ?1. Achilles tendinosis of right lower extremity   ?2. Closed displaced avulsion fracture of tuberosity of right calcaneus, sequela   ?3. Haglund's deformity of right heel   ?4. Gastrocnemius equinus of right lower extremity   ? ?Plan:  ?Patient was evaluated and treated and all questions answered. ? ?S/p foot surgery right ?-Cast was again removed today and all remaining sutures were removed.  She does have a few areas of delayed healing with intact eschar and I recommended she begin applying mupirocin ointment which I prescribed.  I transition her from the cast to weightbearing in a boot with plantarflexion wedges which were applied.  I recommended using cane or walker for balance as  well as a even up shoe brace.  Also will begin physical therapy, we will check to see if she is able to get home therapy. ? ?Return in about 4 weeks (around 01/12/2022) for re-check Achilles tendon, post op (new x-rays).  ?

## 2021-12-20 ENCOUNTER — Encounter: Payer: Self-pay | Admitting: Podiatry

## 2021-12-22 ENCOUNTER — Telehealth: Payer: Self-pay | Admitting: Podiatry

## 2021-12-22 MED ORDER — DOXYCYCLINE HYCLATE 100 MG PO TABS: 100.0000 mg | ORAL_TABLET | Freq: Two times a day (BID) | ORAL | 0 refills | Status: AC

## 2021-12-22 NOTE — Telephone Encounter (Signed)
Pt called in stated beside her incision blisters formed and popped. They are stabbing up but the top of her right foot is still swollen near her toes. It is sore to the touch and itches. Also, wanted to know about in home PT. Please advise. ?

## 2022-01-12 ENCOUNTER — Ambulatory Visit (INDEPENDENT_AMBULATORY_CARE_PROVIDER_SITE_OTHER): Payer: PPO

## 2022-01-12 ENCOUNTER — Ambulatory Visit (INDEPENDENT_AMBULATORY_CARE_PROVIDER_SITE_OTHER): Payer: PPO | Admitting: Podiatry

## 2022-01-12 DIAGNOSIS — S92031S Displaced avulsion fracture of tuberosity of right calcaneus, sequela: Secondary | ICD-10-CM | POA: Diagnosis not present

## 2022-01-12 DIAGNOSIS — L97912 Non-pressure chronic ulcer of unspecified part of right lower leg with fat layer exposed: Secondary | ICD-10-CM | POA: Diagnosis not present

## 2022-01-12 MED ORDER — SANTYL 250 UNIT/GM EX OINT: 1.0000 "application " | TOPICAL_OINTMENT | Freq: Every day | CUTANEOUS | 0 refills | Status: AC

## 2022-01-12 NOTE — Patient Instructions (Signed)
Change dressing daily by cleaning wound with Dial soap and water, then apply the santyl ointment the thickness of a nickel, cover with saline moistened gauze, then dry gauze, then tape

## 2022-01-12 NOTE — Progress Notes (Signed)
  Subjective:  Patient ID: Isabella White, female    DOB: 09-Sep-1945,  MRN: [id number]  Chief Complaint  Patient presents with   Routine Post Op     POV ACHILLES TENDON RIGHT/ X-RAY     76 y.o. female returns for post-op check.  The proximal scab pulled off a few weeks ago and she has a sore.  She has been using the mupirocin ointment that I sent her.  It did feel better after taking antibiotics.  Review of Systems: Negative except as noted in the HPI. Denies N/V/F/Ch.   Objective:  There were no vitals filed for this visit. There is no height or weight on file to calculate BMI. Constitutional Well developed. Well nourished.  Vascular Foot warm and well perfused. Capillary refill normal to all digits.  Calf is soft and supple, no posterior calf or knee pain, negative Homans' sign  Neurologic Normal speech. Oriented to person, place, and time. Epicritic sensation to light touch grossly present bilaterally.  Dermatologic Prior areas of delayed healing have turned into ulcerations measuring 1.5 x 0.5 cm each, 2 separate areas 1 proximal 1 distal none overlying the heel bone.  Maximum depth is 0.3 cm for both.  Exposed subcutaneous tissue.  No exposed tendon.  No cellulitis purulence malodor or signs of infection  Orthopedic: There is minimal tenderness to palpation noted about the surgical site.  Tendon continuity maintained she has good strength    Multiple view plain film radiographs: Interval resection of avulsion fracture fragment, alignment maintained Assessment:   1. Closed displaced avulsion fracture of tuberosity of right calcaneus, sequela   2. Ulcer of right lower extremity with fat layer exposed (HCC)    Plan:  Patient was evaluated and treated and all questions answered.  S/p foot surgery right -Wound is debrided in a full-thickness excisional manner using a sharp scalpel today.  Postdebridement measurements are noted above.  Exposed subcutaneous tissue, no signs of  deep infection or deep dehiscence -Begin collagenase ointment daily compliant with saline wet-to-dry dressings.  Rx sent and instructions in use were reviewed -She did find home health from [facility] will take PT and a home nurse, referral sent for both of these -Dressing supplies were given to hold her over until nursing starts -May WBAT gradually in regular shoe gear, caution to inspect sugar to make sure it is not pressing on the ulceration on the posterior Achilles   Return in about 3 weeks (around 02/02/2022) for wound care.

## 2022-01-19 ENCOUNTER — Telehealth: Payer: Self-pay | Admitting: *Deleted

## 2022-01-19 NOTE — Telephone Encounter (Signed)
-----   Message from Leonor Liv sent at 01/13/2022  3:05 PM EDT ----- Regarding: Home Health Good afternoon,   Received call from Isabella White with [facility] to advise that the could not accepts request for Home Health due to staffing issues.

## 2022-01-19 NOTE — Telephone Encounter (Signed)
Please advise,[facility],faxed referral , confirmation received-01/19/22.

## 2022-01-19 NOTE — Telephone Encounter (Signed)
[facility] is the only other one I know of

## 2022-01-20 NOTE — Telephone Encounter (Signed)
No [facility] is [facility], [facility],I sent to [employer], waiting on response

## 2022-01-20 NOTE — Telephone Encounter (Signed)
Pt called and wanted to leave a message with nurse and I asked if it was about the home health and she said yes.   I checked chart and seen where there has been a new home health order faxed to [facility] and I notified pt. She did said that [facility] stated that if she only needed physical therapy

## 2022-01-21 NOTE — Telephone Encounter (Signed)
Patient is calling for status of [facility] referral declined due to staffing issues.notified patient that we will try another home health. Suggested wound care center but she is unable to walk. Will fax and call [facility] to see if they have availability.Please advise.

## 2022-01-31 ENCOUNTER — Ambulatory Visit (INDEPENDENT_AMBULATORY_CARE_PROVIDER_SITE_OTHER): Payer: PPO | Admitting: Podiatry

## 2022-01-31 DIAGNOSIS — S92031S Displaced avulsion fracture of tuberosity of right calcaneus, sequela: Secondary | ICD-10-CM

## 2022-01-31 DIAGNOSIS — M9261 Juvenile osteochondrosis of tarsus, right ankle: Secondary | ICD-10-CM

## 2022-01-31 DIAGNOSIS — M21861 Other specified acquired deformities of right lower leg: Secondary | ICD-10-CM

## 2022-01-31 DIAGNOSIS — M6788 Other specified disorders of synovium and tendon, other site: Secondary | ICD-10-CM

## 2022-01-31 NOTE — Patient Instructions (Signed)
Call to schedule your therapy:   [facility] [address] [place], [place] [address] [phone]

## 2022-02-04 ENCOUNTER — Encounter: Payer: Self-pay | Admitting: Podiatry

## 2022-02-04 NOTE — Progress Notes (Signed)
  Subjective:  Patient ID: Isabella White, female    DOB: Dec 26, 1945,  MRN: [id number]  Chief Complaint  Patient presents with   Routine Post Op    Wound care surgical foot right      76 y.o. female returns for post-op check.  The proximal scab pulled off a few weeks ago and she has a sore.  She has been using the mupirocin ointment that I sent her.  It did feel better after taking antibiotics.  Review of Systems: Negative except as noted in the HPI. Denies N/V/F/Ch.   Objective:  There were no vitals filed for this visit. There is no height or weight on file to calculate BMI. Constitutional Well developed. Well nourished.  Vascular Foot warm and well perfused. Capillary refill normal to all digits.  Calf is soft and supple, no posterior calf or knee pain, negative Homans' sign  Neurologic Normal speech. Oriented to person, place, and time. Epicritic sensation to light touch grossly present bilaterally.  Dermatologic Areas of delayed healing with superficial ulceration, approximately measures 0.8 x 0.4 x 0.4 for the superior area, the inferior area measured 1.0 x 0.4 x 0.4 cm. Exposed subcutaneous tissue.  No exposed tendon.  No cellulitis purulence malodor or signs of infection  Orthopedic: There is minimal tenderness to palpation noted about the surgical site.  Tendon continuity maintained she has good strength     Multiple view plain film radiographs: Interval resection of avulsion fracture fragment, alignment maintained Assessment:   1. Closed displaced avulsion fracture of tuberosity of right calcaneus, sequela   2. Achilles tendinosis of right lower extremity   3. Haglund's deformity of right heel   4. Gastrocnemius equinus of right lower extremity    Plan:  Patient was evaluated and treated and all questions answered.  S/p foot surgery right -Wound is debrided in a full-thickness excisional manner using a sharp scalpel today.  Postdebridement measurements are noted  above.  Exposed subcutaneous tissue, no signs of deep infection or deep dehiscence -Continue use of collagenase ointment daily with saline wet-to-dry dressings.  -We still have not been able to find a home care service that has availability for physical therapy, she is doing her own home therapy.  I would like her to begin therapy here locally and a referral will be sent to [facility] physical therapy -Continue WBAT in regular shoe gear, avoid pressure on the areas of delayed healing.   Return in about 3 weeks (around 02/21/2022) for wound care.

## 2022-02-15 DIAGNOSIS — M25671 Stiffness of right ankle, not elsewhere classified: Secondary | ICD-10-CM | POA: Diagnosis not present

## 2022-02-15 DIAGNOSIS — M6281 Muscle weakness (generalized): Secondary | ICD-10-CM | POA: Diagnosis not present

## 2022-02-15 DIAGNOSIS — R262 Difficulty in walking, not elsewhere classified: Secondary | ICD-10-CM | POA: Diagnosis not present

## 2022-02-15 DIAGNOSIS — M25571 Pain in right ankle and joints of right foot: Secondary | ICD-10-CM | POA: Diagnosis not present

## 2022-02-17 DIAGNOSIS — M6281 Muscle weakness (generalized): Secondary | ICD-10-CM | POA: Diagnosis not present

## 2022-02-17 DIAGNOSIS — M25671 Stiffness of right ankle, not elsewhere classified: Secondary | ICD-10-CM | POA: Diagnosis not present

## 2022-02-17 DIAGNOSIS — M25571 Pain in right ankle and joints of right foot: Secondary | ICD-10-CM | POA: Diagnosis not present

## 2022-02-17 DIAGNOSIS — R262 Difficulty in walking, not elsewhere classified: Secondary | ICD-10-CM | POA: Diagnosis not present

## 2022-02-21 DIAGNOSIS — M810 Age-related osteoporosis without current pathological fracture: Secondary | ICD-10-CM | POA: Diagnosis not present

## 2022-02-23 DIAGNOSIS — R262 Difficulty in walking, not elsewhere classified: Secondary | ICD-10-CM | POA: Diagnosis not present

## 2022-02-23 DIAGNOSIS — M25671 Stiffness of right ankle, not elsewhere classified: Secondary | ICD-10-CM | POA: Diagnosis not present

## 2022-02-23 DIAGNOSIS — M25571 Pain in right ankle and joints of right foot: Secondary | ICD-10-CM | POA: Diagnosis not present

## 2022-02-23 DIAGNOSIS — M6281 Muscle weakness (generalized): Secondary | ICD-10-CM | POA: Diagnosis not present

## 2022-02-25 DIAGNOSIS — R262 Difficulty in walking, not elsewhere classified: Secondary | ICD-10-CM | POA: Diagnosis not present

## 2022-02-25 DIAGNOSIS — M25671 Stiffness of right ankle, not elsewhere classified: Secondary | ICD-10-CM | POA: Diagnosis not present

## 2022-02-25 DIAGNOSIS — M25571 Pain in right ankle and joints of right foot: Secondary | ICD-10-CM | POA: Diagnosis not present

## 2022-02-25 DIAGNOSIS — M6281 Muscle weakness (generalized): Secondary | ICD-10-CM | POA: Diagnosis not present

## 2022-02-28 ENCOUNTER — Ambulatory Visit (INDEPENDENT_AMBULATORY_CARE_PROVIDER_SITE_OTHER): Payer: PPO | Admitting: Podiatry

## 2022-02-28 DIAGNOSIS — M6788 Other specified disorders of synovium and tendon, other site: Secondary | ICD-10-CM

## 2022-02-28 DIAGNOSIS — L97912 Non-pressure chronic ulcer of unspecified part of right lower leg with fat layer exposed: Secondary | ICD-10-CM

## 2022-02-28 DIAGNOSIS — S92031S Displaced avulsion fracture of tuberosity of right calcaneus, sequela: Secondary | ICD-10-CM | POA: Diagnosis not present

## 2022-02-28 NOTE — Progress Notes (Signed)
  Subjective:  Patient ID: Isabella White, female    DOB: 08/15/1946,  MRN: [id number]  Chief Complaint  Patient presents with   Routine Post Op     Post op-Wound care     76 y.o. female returns for post-op check.  Feels like it is doing much better she is doing therapy  Review of Systems: Negative except as noted in the HPI. Denies N/V/F/Ch.   Objective:  There were no vitals filed for this visit. There is no height or weight on file to calculate BMI. Constitutional Well developed. Well nourished.  Vascular Foot warm and well perfused. Capillary refill normal to all digits.  Calf is soft and supple, no posterior calf or knee pain, negative Homans' sign  Neurologic Normal speech. Oriented to person, place, and time. Epicritic sensation to light touch grossly present bilaterally.  Dermatologic Areas of delayed healing with superficial ulceration, superior area has completely healed, the inferior area measured 0.5 x 0.1 x 0.1 cm. Exposed subcutaneous tissue.  No exposed tendon.  No cellulitis purulence malodor or signs of infection  Orthopedic: There is minimal tenderness to palpation noted about the surgical site.  Tendon continuity maintained she has good strength      Multiple view plain film radiographs: Interval resection of avulsion fracture fragment, alignment maintained Assessment:   1. Closed displaced avulsion fracture of tuberosity of right calcaneus, sequela   2. Achilles tendinosis of right lower extremity   3. Ulcer of right lower extremity with fat layer exposed (HCC)     Plan:  Patient was evaluated and treated and all questions answered.  S/p foot surgery right -Wound is debrided in a full-thickness excisional manner using a sharp scalpel today.  Postdebridement measurements are noted above.  Exposed subcutaneous tissue, no signs of deep infection or deep dehiscence -Continue use of collagenase ointment daily with saline wet-to-dry dressings.  -Continue  physical therapy, but WBAT at this point in regular shoe gear   Return in about 3 weeks (around 03/21/2022) for wound check .

## 2022-03-01 DIAGNOSIS — R262 Difficulty in walking, not elsewhere classified: Secondary | ICD-10-CM | POA: Diagnosis not present

## 2022-03-01 DIAGNOSIS — M25571 Pain in right ankle and joints of right foot: Secondary | ICD-10-CM | POA: Diagnosis not present

## 2022-03-01 DIAGNOSIS — M25671 Stiffness of right ankle, not elsewhere classified: Secondary | ICD-10-CM | POA: Diagnosis not present

## 2022-03-01 DIAGNOSIS — M6281 Muscle weakness (generalized): Secondary | ICD-10-CM | POA: Diagnosis not present

## 2022-03-03 DIAGNOSIS — M25671 Stiffness of right ankle, not elsewhere classified: Secondary | ICD-10-CM | POA: Diagnosis not present

## 2022-03-03 DIAGNOSIS — R262 Difficulty in walking, not elsewhere classified: Secondary | ICD-10-CM | POA: Diagnosis not present

## 2022-03-03 DIAGNOSIS — M25571 Pain in right ankle and joints of right foot: Secondary | ICD-10-CM | POA: Diagnosis not present

## 2022-03-03 DIAGNOSIS — M6281 Muscle weakness (generalized): Secondary | ICD-10-CM | POA: Diagnosis not present

## 2022-03-08 DIAGNOSIS — M25571 Pain in right ankle and joints of right foot: Secondary | ICD-10-CM | POA: Diagnosis not present

## 2022-03-08 DIAGNOSIS — M6281 Muscle weakness (generalized): Secondary | ICD-10-CM | POA: Diagnosis not present

## 2022-03-08 DIAGNOSIS — R262 Difficulty in walking, not elsewhere classified: Secondary | ICD-10-CM | POA: Diagnosis not present

## 2022-03-08 DIAGNOSIS — M25671 Stiffness of right ankle, not elsewhere classified: Secondary | ICD-10-CM | POA: Diagnosis not present

## 2022-03-10 DIAGNOSIS — M25671 Stiffness of right ankle, not elsewhere classified: Secondary | ICD-10-CM | POA: Diagnosis not present

## 2022-03-10 DIAGNOSIS — M25571 Pain in right ankle and joints of right foot: Secondary | ICD-10-CM | POA: Diagnosis not present

## 2022-03-10 DIAGNOSIS — M6281 Muscle weakness (generalized): Secondary | ICD-10-CM | POA: Diagnosis not present

## 2022-03-10 DIAGNOSIS — R262 Difficulty in walking, not elsewhere classified: Secondary | ICD-10-CM | POA: Diagnosis not present

## 2022-03-14 DIAGNOSIS — R262 Difficulty in walking, not elsewhere classified: Secondary | ICD-10-CM | POA: Diagnosis not present

## 2022-03-14 DIAGNOSIS — M6281 Muscle weakness (generalized): Secondary | ICD-10-CM | POA: Diagnosis not present

## 2022-03-14 DIAGNOSIS — M25671 Stiffness of right ankle, not elsewhere classified: Secondary | ICD-10-CM | POA: Diagnosis not present

## 2022-03-14 DIAGNOSIS — M25571 Pain in right ankle and joints of right foot: Secondary | ICD-10-CM | POA: Diagnosis not present

## 2022-03-16 ENCOUNTER — Ambulatory Visit (INDEPENDENT_AMBULATORY_CARE_PROVIDER_SITE_OTHER): Payer: PPO | Admitting: Podiatry

## 2022-03-16 DIAGNOSIS — M6281 Muscle weakness (generalized): Secondary | ICD-10-CM | POA: Diagnosis not present

## 2022-03-16 DIAGNOSIS — L309 Dermatitis, unspecified: Secondary | ICD-10-CM | POA: Diagnosis not present

## 2022-03-16 DIAGNOSIS — M6788 Other specified disorders of synovium and tendon, other site: Secondary | ICD-10-CM

## 2022-03-16 DIAGNOSIS — S92031S Displaced avulsion fracture of tuberosity of right calcaneus, sequela: Secondary | ICD-10-CM | POA: Diagnosis not present

## 2022-03-16 DIAGNOSIS — R262 Difficulty in walking, not elsewhere classified: Secondary | ICD-10-CM | POA: Diagnosis not present

## 2022-03-16 DIAGNOSIS — M25571 Pain in right ankle and joints of right foot: Secondary | ICD-10-CM | POA: Diagnosis not present

## 2022-03-16 DIAGNOSIS — M25671 Stiffness of right ankle, not elsewhere classified: Secondary | ICD-10-CM | POA: Diagnosis not present

## 2022-03-16 MED ORDER — TRIAMCINOLONE ACETONIDE 0.5 % EX OINT: 1.0000 | TOPICAL_OINTMENT | Freq: Two times a day (BID) | CUTANEOUS | 0 refills | Status: AC

## 2022-03-20 NOTE — Progress Notes (Signed)
  Subjective:  Patient ID: Isabella White, female    DOB: 03/10/1946,  MRN: [id number]  Chief Complaint  Patient presents with   Post-op Problem       post op 3 weeks/possible wound opening up again per  physical therapist - PT has refused to see her again until she is evaluated by Dr, Lilian Kapur     76 y.o. female returns for post-op check.  Her physical therapist felt that the wound was opening up again but she thinks it is doing good  Review of Systems: Negative except as noted in the HPI. Denies N/V/F/Ch.   Objective:  There were no vitals filed for this visit. There is no height or weight on file to calculate BMI. Constitutional Well developed. Well nourished.  Vascular Foot warm and well perfused. Capillary refill normal to all digits.  Calf is soft and supple, no posterior calf or knee pain, negative Homans' sign  Neurologic Normal speech. Oriented to person, place, and time. Epicritic sensation to light touch grossly present bilaterally.  Dermatologic Incision has fully healed.  She does have some sensitivity along the scar.  She does have some areas of dermatitis on the medial calf that are itchy  Orthopedic: There is minimal tenderness to palpation noted about the surgical site.  Tendon continuity maintained she has good strength      Multiple view plain film radiographs: Interval resection of avulsion fracture fragment, alignment maintained Assessment:   1. Closed displaced avulsion fracture of tuberosity of right calcaneus, sequela   2. Achilles tendinosis of right lower extremity   3. Dermatitis      Plan:  Patient was evaluated and treated and all questions answered.  S/p foot surgery right -Doing very well to my exam today she has healed her area of delayed healing.  She has good 5 out of 5 strength in the tendon and her function is improving.  I recommend she continue physical therapy to maximize this.  I will reevaluate her in 8 weeks..  She did have some  dermatitis today.  I prescribed her triamcinolone ointment for this   Return in about 8 weeks (around 05/11/2022) for re-check Achilles tendon.

## 2022-03-23 ENCOUNTER — Encounter: Payer: PPO | Admitting: Podiatry

## 2022-03-28 DIAGNOSIS — M25571 Pain in right ankle and joints of right foot: Secondary | ICD-10-CM | POA: Diagnosis not present

## 2022-03-28 DIAGNOSIS — M25671 Stiffness of right ankle, not elsewhere classified: Secondary | ICD-10-CM | POA: Diagnosis not present

## 2022-03-28 DIAGNOSIS — M6281 Muscle weakness (generalized): Secondary | ICD-10-CM | POA: Diagnosis not present

## 2022-03-28 DIAGNOSIS — R262 Difficulty in walking, not elsewhere classified: Secondary | ICD-10-CM | POA: Diagnosis not present

## 2022-03-30 ENCOUNTER — Other Ambulatory Visit: Payer: Self-pay | Admitting: Internal Medicine

## 2022-03-30 DIAGNOSIS — Z1231 Encounter for screening mammogram for malignant neoplasm of breast: Secondary | ICD-10-CM

## 2022-04-26 DIAGNOSIS — E1165 Type 2 diabetes mellitus with hyperglycemia: Secondary | ICD-10-CM | POA: Diagnosis not present

## 2022-04-26 DIAGNOSIS — E538 Deficiency of other specified B group vitamins: Secondary | ICD-10-CM | POA: Diagnosis not present

## 2022-04-26 DIAGNOSIS — E7849 Other hyperlipidemia: Secondary | ICD-10-CM | POA: Diagnosis not present

## 2022-04-26 DIAGNOSIS — Z794 Long term (current) use of insulin: Secondary | ICD-10-CM | POA: Diagnosis not present

## 2022-04-26 DIAGNOSIS — E119 Type 2 diabetes mellitus without complications: Secondary | ICD-10-CM | POA: Diagnosis not present

## 2022-04-27 ENCOUNTER — Ambulatory Visit
Admission: RE | Admit: 2022-04-27 | Discharge: 2022-04-27 | Disposition: A | Discharge status: Home / Self Care | Payer: PPO | Source: Ambulatory Visit | Attending: Internal Medicine | Admitting: Internal Medicine

## 2022-04-27 DIAGNOSIS — Z1231 Encounter for screening mammogram for malignant neoplasm of breast: Secondary | ICD-10-CM | POA: Insufficient documentation

## 2022-05-03 ENCOUNTER — Other Ambulatory Visit: Payer: Self-pay | Admitting: Internal Medicine

## 2022-05-03 DIAGNOSIS — Z794 Long term (current) use of insulin: Secondary | ICD-10-CM | POA: Diagnosis not present

## 2022-05-03 DIAGNOSIS — R928 Other abnormal and inconclusive findings on diagnostic imaging of breast: Secondary | ICD-10-CM

## 2022-05-03 DIAGNOSIS — N63 Unspecified lump in unspecified breast: Secondary | ICD-10-CM

## 2022-05-03 DIAGNOSIS — E119 Type 2 diabetes mellitus without complications: Secondary | ICD-10-CM | POA: Diagnosis not present

## 2022-05-04 ENCOUNTER — Encounter: Payer: Self-pay | Admitting: Podiatry

## 2022-05-04 ENCOUNTER — Ambulatory Visit (INDEPENDENT_AMBULATORY_CARE_PROVIDER_SITE_OTHER): Payer: PPO | Admitting: Podiatry

## 2022-05-04 DIAGNOSIS — M6788 Other specified disorders of synovium and tendon, other site: Secondary | ICD-10-CM

## 2022-05-04 DIAGNOSIS — S92031S Displaced avulsion fracture of tuberosity of right calcaneus, sequela: Secondary | ICD-10-CM | POA: Diagnosis not present

## 2022-05-04 DIAGNOSIS — M21861 Other specified acquired deformities of right lower leg: Secondary | ICD-10-CM | POA: Diagnosis not present

## 2022-05-04 NOTE — Progress Notes (Signed)
  Subjective:  Patient ID: Isabella White, female    DOB: 03/14/1946,  MRN: [id number]  Chief Complaint  Patient presents with   Tendonitis    RE-CHECK ACHILLES TENDON     76 y.o. female returns for post-op check.  Doing much better.  The wound remains fully healed.  Says she does have some pain occasionally but overall pleased Review of Systems: Negative except as noted in the HPI. Denies N/V/F/Ch.   Objective:  There were no vitals filed for this visit. There is no height or weight on file to calculate BMI. Constitutional Well developed. Well nourished.  Vascular Foot warm and well perfused. Capillary refill normal to all digits.  Calf is soft and supple, no posterior calf or knee pain, negative Homans' sign  Neurologic Normal speech. Oriented to person, place, and time. Epicritic sensation to light touch grossly present bilaterally.  Dermatologic Incision has fully healed.  Not hypertrophic incision.  Some sensitivity  Orthopedic: Good 5 out of 5 strength in plantarflexion nearly symmetric with the other side      Multiple view plain film radiographs: Interval resection of avulsion fracture fragment, alignment maintained Assessment:   1. Closed displaced avulsion fracture of tuberosity of right calcaneus, sequela   2. Achilles tendinosis of right lower extremity   3. Gastrocnemius equinus of right lower extremity      Plan:  Patient was evaluated and treated and all questions answered.  S/p foot surgery right -Overall doing quite well.  I discussed with her that I think she is nearly at maximal improvement suspect some strength and conditioning improvement over the next 6 months.  I do not have further restrictions for her at this point.  She does require assistance to ambulate, she uses a walker.  I completed a permanent handicap placard paperwork for her today she will use long-term.  I will see her back on a as needed basis at this point   No follow-ups on file.

## 2022-05-05 DIAGNOSIS — H353132 Nonexudative age-related macular degeneration, bilateral, intermediate dry stage: Secondary | ICD-10-CM | POA: Diagnosis not present

## 2022-05-10 DIAGNOSIS — F3342 Major depressive disorder, recurrent, in full remission: Secondary | ICD-10-CM | POA: Diagnosis not present

## 2022-05-10 DIAGNOSIS — E1165 Type 2 diabetes mellitus with hyperglycemia: Secondary | ICD-10-CM | POA: Diagnosis not present

## 2022-05-10 DIAGNOSIS — D509 Iron deficiency anemia, unspecified: Secondary | ICD-10-CM | POA: Diagnosis not present

## 2022-05-10 DIAGNOSIS — J309 Allergic rhinitis, unspecified: Secondary | ICD-10-CM | POA: Diagnosis not present

## 2022-05-10 DIAGNOSIS — E538 Deficiency of other specified B group vitamins: Secondary | ICD-10-CM | POA: Diagnosis not present

## 2022-05-10 DIAGNOSIS — J449 Chronic obstructive pulmonary disease, unspecified: Secondary | ICD-10-CM | POA: Diagnosis not present

## 2022-05-10 DIAGNOSIS — I2510 Atherosclerotic heart disease of native coronary artery without angina pectoris: Secondary | ICD-10-CM | POA: Diagnosis not present

## 2022-05-10 DIAGNOSIS — E559 Vitamin D deficiency, unspecified: Secondary | ICD-10-CM | POA: Diagnosis not present

## 2022-05-10 DIAGNOSIS — I10 Essential (primary) hypertension: Secondary | ICD-10-CM | POA: Diagnosis not present

## 2022-05-10 DIAGNOSIS — I252 Old myocardial infarction: Secondary | ICD-10-CM | POA: Diagnosis not present

## 2022-05-10 DIAGNOSIS — G4733 Obstructive sleep apnea (adult) (pediatric): Secondary | ICD-10-CM | POA: Diagnosis not present

## 2022-05-11 ENCOUNTER — Ambulatory Visit: Payer: PPO | Admitting: Podiatry

## 2022-05-11 DIAGNOSIS — D509 Iron deficiency anemia, unspecified: Secondary | ICD-10-CM | POA: Diagnosis not present

## 2022-05-11 DIAGNOSIS — E538 Deficiency of other specified B group vitamins: Secondary | ICD-10-CM | POA: Diagnosis not present

## 2022-05-11 DIAGNOSIS — Z0000 Encounter for general adult medical examination without abnormal findings: Secondary | ICD-10-CM | POA: Diagnosis not present

## 2022-05-11 DIAGNOSIS — I10 Essential (primary) hypertension: Secondary | ICD-10-CM | POA: Diagnosis not present

## 2022-05-11 DIAGNOSIS — F3342 Major depressive disorder, recurrent, in full remission: Secondary | ICD-10-CM | POA: Diagnosis not present

## 2022-05-11 DIAGNOSIS — M791 Myalgia, unspecified site: Secondary | ICD-10-CM | POA: Diagnosis not present

## 2022-05-11 DIAGNOSIS — E1165 Type 2 diabetes mellitus with hyperglycemia: Secondary | ICD-10-CM | POA: Diagnosis not present

## 2022-05-11 DIAGNOSIS — G4733 Obstructive sleep apnea (adult) (pediatric): Secondary | ICD-10-CM | POA: Diagnosis not present

## 2022-05-11 DIAGNOSIS — K219 Gastro-esophageal reflux disease without esophagitis: Secondary | ICD-10-CM | POA: Diagnosis not present

## 2022-05-11 DIAGNOSIS — E1169 Type 2 diabetes mellitus with other specified complication: Secondary | ICD-10-CM | POA: Diagnosis not present

## 2022-05-11 DIAGNOSIS — E7849 Other hyperlipidemia: Secondary | ICD-10-CM | POA: Diagnosis not present

## 2022-05-18 ENCOUNTER — Ambulatory Visit
Admission: RE | Admit: 2022-05-18 | Discharge: 2022-05-18 | Disposition: A | Discharge status: Home / Self Care | Payer: PPO | Source: Ambulatory Visit | Attending: Internal Medicine | Admitting: Internal Medicine

## 2022-05-18 DIAGNOSIS — R928 Other abnormal and inconclusive findings on diagnostic imaging of breast: Secondary | ICD-10-CM | POA: Diagnosis not present

## 2022-05-18 DIAGNOSIS — N63 Unspecified lump in unspecified breast: Secondary | ICD-10-CM | POA: Diagnosis not present

## 2022-05-18 DIAGNOSIS — R922 Inconclusive mammogram: Secondary | ICD-10-CM | POA: Diagnosis not present

## 2022-05-23 DIAGNOSIS — G4733 Obstructive sleep apnea (adult) (pediatric): Secondary | ICD-10-CM | POA: Diagnosis not present

## 2022-05-23 DIAGNOSIS — J4489 Other specified chronic obstructive pulmonary disease: Secondary | ICD-10-CM | POA: Diagnosis not present

## 2022-05-23 DIAGNOSIS — E663 Overweight: Secondary | ICD-10-CM | POA: Diagnosis not present

## 2022-07-06 ENCOUNTER — Other Ambulatory Visit: Payer: Self-pay

## 2022-07-06 MED ORDER — COVID-19 MRNA VAC-TRIS(PFIZER) 30 MCG/0.3ML IM SUSY
PREFILLED_SYRINGE | INTRAMUSCULAR | 0 refills | Status: AC
  Filled 2022-07-07: qty 0.3, 1d supply, fill #0

## 2022-07-07 ENCOUNTER — Other Ambulatory Visit: Payer: Self-pay

## 2022-08-03 DIAGNOSIS — I10 Essential (primary) hypertension: Secondary | ICD-10-CM | POA: Diagnosis not present

## 2022-08-03 DIAGNOSIS — S81802A Unspecified open wound, left lower leg, initial encounter: Secondary | ICD-10-CM | POA: Diagnosis not present

## 2022-08-03 DIAGNOSIS — E1165 Type 2 diabetes mellitus with hyperglycemia: Secondary | ICD-10-CM | POA: Diagnosis not present

## 2022-08-03 DIAGNOSIS — Z794 Long term (current) use of insulin: Secondary | ICD-10-CM | POA: Diagnosis not present

## 2022-08-03 DIAGNOSIS — W010XXA Fall on same level from slipping, tripping and stumbling without subsequent striking against object, initial encounter: Secondary | ICD-10-CM | POA: Diagnosis not present

## 2022-10-31 DIAGNOSIS — Z794 Long term (current) use of insulin: Secondary | ICD-10-CM | POA: Diagnosis not present

## 2022-10-31 DIAGNOSIS — E669 Obesity, unspecified: Secondary | ICD-10-CM | POA: Diagnosis not present

## 2022-10-31 DIAGNOSIS — E1169 Type 2 diabetes mellitus with other specified complication: Secondary | ICD-10-CM | POA: Diagnosis not present

## 2022-10-31 DIAGNOSIS — E7849 Other hyperlipidemia: Secondary | ICD-10-CM | POA: Diagnosis not present

## 2022-10-31 DIAGNOSIS — D509 Iron deficiency anemia, unspecified: Secondary | ICD-10-CM | POA: Diagnosis not present

## 2022-10-31 DIAGNOSIS — E538 Deficiency of other specified B group vitamins: Secondary | ICD-10-CM | POA: Diagnosis not present

## 2022-10-31 DIAGNOSIS — E1165 Type 2 diabetes mellitus with hyperglycemia: Secondary | ICD-10-CM | POA: Diagnosis not present

## 2022-11-03 DIAGNOSIS — H353131 Nonexudative age-related macular degeneration, bilateral, early dry stage: Secondary | ICD-10-CM | POA: Diagnosis not present

## 2022-11-03 DIAGNOSIS — H43812 Vitreous degeneration, left eye: Secondary | ICD-10-CM | POA: Diagnosis not present

## 2022-11-07 DIAGNOSIS — E119 Type 2 diabetes mellitus without complications: Secondary | ICD-10-CM | POA: Diagnosis not present

## 2022-11-07 DIAGNOSIS — I351 Nonrheumatic aortic (valve) insufficiency: Secondary | ICD-10-CM | POA: Diagnosis not present

## 2022-11-07 DIAGNOSIS — D509 Iron deficiency anemia, unspecified: Secondary | ICD-10-CM | POA: Diagnosis not present

## 2022-11-07 DIAGNOSIS — E669 Obesity, unspecified: Secondary | ICD-10-CM | POA: Diagnosis not present

## 2022-11-07 DIAGNOSIS — E1169 Type 2 diabetes mellitus with other specified complication: Secondary | ICD-10-CM | POA: Diagnosis not present

## 2022-11-07 DIAGNOSIS — G4733 Obstructive sleep apnea (adult) (pediatric): Secondary | ICD-10-CM | POA: Diagnosis not present

## 2022-11-07 DIAGNOSIS — Z794 Long term (current) use of insulin: Secondary | ICD-10-CM | POA: Diagnosis not present

## 2022-11-07 DIAGNOSIS — J438 Other emphysema: Secondary | ICD-10-CM | POA: Diagnosis not present

## 2022-11-07 DIAGNOSIS — F3342 Major depressive disorder, recurrent, in full remission: Secondary | ICD-10-CM | POA: Diagnosis not present

## 2022-11-07 DIAGNOSIS — I10 Essential (primary) hypertension: Secondary | ICD-10-CM | POA: Diagnosis not present

## 2022-11-07 DIAGNOSIS — E1165 Type 2 diabetes mellitus with hyperglycemia: Secondary | ICD-10-CM | POA: Diagnosis not present

## 2022-11-07 DIAGNOSIS — E7849 Other hyperlipidemia: Secondary | ICD-10-CM | POA: Diagnosis not present

## 2022-11-28 DIAGNOSIS — G4733 Obstructive sleep apnea (adult) (pediatric): Secondary | ICD-10-CM | POA: Diagnosis not present

## 2022-11-28 DIAGNOSIS — J4520 Mild intermittent asthma, uncomplicated: Secondary | ICD-10-CM | POA: Diagnosis not present

## 2023-01-18 ENCOUNTER — Other Ambulatory Visit: Payer: Self-pay

## 2023-01-18 ENCOUNTER — Emergency Department
Admission: EM | Admit: 2023-01-18 | Discharge: 2023-01-18 | Disposition: A | Discharge status: Home / Self Care | Payer: PPO | Attending: Emergency Medicine | Admitting: Emergency Medicine

## 2023-01-18 ENCOUNTER — Emergency Department: Payer: PPO

## 2023-01-18 DIAGNOSIS — M549 Dorsalgia, unspecified: Secondary | ICD-10-CM | POA: Insufficient documentation

## 2023-01-18 DIAGNOSIS — S0101XA Laceration without foreign body of scalp, initial encounter: Secondary | ICD-10-CM

## 2023-01-18 DIAGNOSIS — M25562 Pain in left knee: Secondary | ICD-10-CM | POA: Insufficient documentation

## 2023-01-18 DIAGNOSIS — M19032 Primary osteoarthritis, left wrist: Secondary | ICD-10-CM | POA: Diagnosis not present

## 2023-01-18 DIAGNOSIS — S0003XA Contusion of scalp, initial encounter: Secondary | ICD-10-CM | POA: Diagnosis not present

## 2023-01-18 DIAGNOSIS — S0181XA Laceration without foreign body of other part of head, initial encounter: Secondary | ICD-10-CM | POA: Diagnosis not present

## 2023-01-18 DIAGNOSIS — Y92000 Kitchen of unspecified non-institutional (private) residence as  the place of occurrence of the external cause: Secondary | ICD-10-CM | POA: Diagnosis not present

## 2023-01-18 DIAGNOSIS — Z23 Encounter for immunization: Secondary | ICD-10-CM | POA: Diagnosis not present

## 2023-01-18 DIAGNOSIS — M1712 Unilateral primary osteoarthritis, left knee: Secondary | ICD-10-CM | POA: Diagnosis not present

## 2023-01-18 DIAGNOSIS — W19XXXA Unspecified fall, initial encounter: Secondary | ICD-10-CM | POA: Insufficient documentation

## 2023-01-18 DIAGNOSIS — S80919A Unspecified superficial injury of unspecified knee, initial encounter: Secondary | ICD-10-CM | POA: Diagnosis not present

## 2023-01-18 DIAGNOSIS — R58 Hemorrhage, not elsewhere classified: Secondary | ICD-10-CM | POA: Diagnosis not present

## 2023-01-18 DIAGNOSIS — T07XXXA Unspecified multiple injuries, initial encounter: Secondary | ICD-10-CM

## 2023-01-18 DIAGNOSIS — S0990XA Unspecified injury of head, initial encounter: Secondary | ICD-10-CM

## 2023-01-18 DIAGNOSIS — Z043 Encounter for examination and observation following other accident: Secondary | ICD-10-CM | POA: Diagnosis not present

## 2023-01-18 DIAGNOSIS — M4312 Spondylolisthesis, cervical region: Secondary | ICD-10-CM | POA: Diagnosis not present

## 2023-01-18 DIAGNOSIS — S6992XA Unspecified injury of left wrist, hand and finger(s), initial encounter: Secondary | ICD-10-CM | POA: Diagnosis not present

## 2023-01-18 MED ORDER — METHOCARBAMOL 500 MG PO TABS
500.0000 mg | ORAL_TABLET | Freq: Once | ORAL | Status: AC
  Administered 2023-01-18: 500 mg via ORAL
  Filled 2023-01-18: qty 1

## 2023-01-18 MED ORDER — PREDNISONE 50 MG PO TABS: 50.0000 mg | ORAL_TABLET | Freq: Every day | ORAL | 0 refills | Status: AC

## 2023-01-18 MED ORDER — OXYCODONE-ACETAMINOPHEN 5-325 MG PO TABS: 1.0000 | ORAL_TABLET | Freq: Four times a day (QID) | ORAL | 0 refills | Status: AC | PRN

## 2023-01-18 MED ORDER — METHOCARBAMOL 500 MG PO TABS: 500.0000 mg | ORAL_TABLET | Freq: Four times a day (QID) | ORAL | 0 refills | Status: AC

## 2023-01-18 MED ORDER — CEPHALEXIN 500 MG PO CAPS: 1000.0000 mg | ORAL_CAPSULE | Freq: Two times a day (BID) | ORAL | 0 refills | Status: DC

## 2023-01-18 MED ORDER — HYDROCODONE-ACETAMINOPHEN 5-325 MG PO TABS
1.0000 | ORAL_TABLET | Freq: Once | ORAL | Status: AC
  Administered 2023-01-18: 1 via ORAL
  Filled 2023-01-18: qty 1

## 2023-01-18 MED ORDER — TETANUS-DIPHTH-ACELL PERTUSSIS 5-2.5-18.5 LF-MCG/0.5 IM SUSY
0.5000 mL | PREFILLED_SYRINGE | Freq: Once | INTRAMUSCULAR | Status: AC
  Administered 2023-01-18: 0.5 mL via INTRAMUSCULAR
  Filled 2023-01-18: qty 0.5

## 2023-01-18 MED ORDER — LIDOCAINE HCL (PF) 1 % IJ SOLN
15.0000 mL | Freq: Once | INTRAMUSCULAR | Status: AC
  Administered 2023-01-18: 15 mL
  Filled 2023-01-18: qty 15

## 2023-01-18 MED ORDER — LIDOCAINE-EPINEPHRINE (PF) 1 %-1:200000 IJ SOLN
20.0000 mL | Freq: Once | INTRAMUSCULAR | Status: DC
Start: 2023-01-18 — End: 2023-01-18

## 2023-01-18 MED ORDER — PREDNISONE 20 MG PO TABS
60.0000 mg | ORAL_TABLET | Freq: Once | ORAL | Status: AC
  Administered 2023-01-18: 60 mg via ORAL
  Filled 2023-01-18: qty 3

## 2023-01-18 NOTE — ED Notes (Signed)
Lac at L forehead not currently bleeding when student PA removed dressing; lac doesn't currently appear very deep but is wide/long (2.5-3 inches in length). Visitor at bedside.

## 2023-01-18 NOTE — ED Triage Notes (Signed)
Pt in via EMS from home; mechanical fall; hit head on dish washer; lac L forehead; abd pad around it by EMS; neuro intact; A&Ox4; L wrist/back/knee pain; no blood thinners; no LOC; usually uses cane but wasn't at time of fall. 136/84; 88pulse 100% RA; 17RR; VSS; ambulatory with EMS but this inc pain level. EMS palpated spine; no abnormalities noted during palpation per EMS.

## 2023-01-18 NOTE — ED Notes (Signed)
Wound to scalp stapled by pa-c  pt tolerated well.

## 2023-01-18 NOTE — ED Provider Notes (Signed)
[facility] Provider Note  Patient Contact: 9:24 PM (approximate)   History   Fall   HPI  Isabella White is a 77 y.o. female who presents the emergency department after a mechanical fall.  Patient fell in her kitchen, hitting her head on the edge of a dishwasher.  Patient has a large laceration to the left forehead and scalp.  Did not lose consciousness.  Denies any severe headache, vision changes, slurred speech.  Patient is primarily complaining of back and knee pain.  She did land on her left wrist as well.  No other open wounds.  No bleeding or clotting disorders.  Patient does not take blood thinners.  No loss of consciousness.     Physical Exam   Triage Vital Signs: ED Triage Vitals  Enc Vitals Group     BP 01/18/23 1846 (!) 147/64     Pulse Rate 01/18/23 1847 83     Resp 01/18/23 1846 17     Temp 01/18/23 1850 98.1 F (36.7 C)     Temp Source 01/18/23 1850 Oral     SpO2 01/18/23 1847 99 %     Weight 01/18/23 1852 210 lb 8.6 oz (95.5 kg)     Height 01/18/23 1852 5\' 2"  (1.575 m)     Head Circumference --      Peak Flow --      Pain Score 01/18/23 1848 2     Pain Loc --      Pain Edu? --      Excl. in GC? --     Most recent vital signs: Vitals:   01/18/23 1900 01/18/23 1930  BP: (!) 146/63   Pulse: 87 85  Resp:    Temp:    SpO2: 99% 100%     General: Alert and in no acute distress. Eyes:  PERRL. EOMI. Head: Deep laceration to the skull noted on the forehead extending into the left temporal and parietal skull.  Bleeding controlled direct pressure.  No other visible signs of trauma to the skull or face.  Laceration measures approximately 15 cm in length.  Deep to the skull.  Neck: No stridor. No cervical spine tenderness to palpation.  Cardiovascular:  Good peripheral perfusion Respiratory: Normal respiratory effort without tachypnea or retractions. Lungs CTAB. Musculoskeletal: Full range of motion to all extremities.  Tenderness to  the lumbar spine without palpable abnormality or step-off.  No point specific tenderness but again diffuse midline and bilateral tenderness.  No extension into the SI joints or hips.  Examination of the left knee reveals tenderness along the anterior aspect without deformity, edema, ecchymosis or open wounds.  Examination of the left wrist reveals no deformity but there is some tenderness over the distal radius.  No palpable abnormality.  Pulses x 4 extremities.  Sensation intact in all extremities. Neurologic:  No gross focal neurologic deficits are appreciated.  Cranial nerves II through XII grossly intact. Skin:   No rash noted Other:   ED Results / Procedures / Treatments   Labs (all labs ordered are listed, but only abnormal results are displayed) Labs Reviewed - No data to display   EKG     RADIOLOGY  I personally viewed, evaluated, and interpreted these images as part of my medical decision making, as well as reviewing the written report by the radiologist.  ED Provider Interpretation: Visualization of imaging reveals no acute traumatic findings on x-rays of the lumbar spine, knee or wrist.  CT scan reveals  soft tissue deficit but no intracranial abnormality.  DG Knee Complete 4 Views Left  Result Date: 01/18/2023 CLINICAL DATA:  Trauma, fall EXAM: LEFT KNEE - COMPLETE 4+ VIEW COMPARISON:  None Available. FINDINGS: No recent fracture or dislocation is seen. There is no significant effusion in suprapatellar bursa. Degenerative changes with bony spurs are noted in medial and lateral compartments. IMPRESSION: No recent fracture is seen left knee. Degenerative changes with small bony spurs are noted. Electronically Signed   By: Ernie Avena M.D.   On: 01/18/2023 20:27   DG Lumbar Spine 2-3 Views  Result Date: 01/18/2023 CLINICAL DATA:  Trauma, fall EXAM: LUMBAR SPINE - 2-3 VIEW COMPARISON:  None Available. FINDINGS: No recent fracture is seen. There is mild anterolisthesis at  L4-L5 level. Degenerative changes are noted with bony spurs in lumbar spine and lower thoracic spine. IMPRESSION: No recent fracture is seen. There is minimal anterolisthesis at L4-L5 level. Degenerative changes are noted with bony spurs, disc space narrowing and facet hypertrophy at multiple levels. Electronically Signed   By: Ernie Avena M.D.   On: 01/18/2023 20:26   CT Head Wo Contrast  Result Date: 01/18/2023 CLINICAL DATA:  Polytrauma, blunt EXAM: CT HEAD WITHOUT CONTRAST CT CERVICAL SPINE WITHOUT CONTRAST TECHNIQUE: Multidetector CT imaging of the head and cervical spine was performed following the standard protocol without intravenous contrast. Multiplanar CT image reconstructions of the cervical spine were also generated. RADIATION DOSE REDUCTION: This exam was performed according to the departmental dose-optimization program which includes automated exposure control, adjustment of the mA and/or kV according to patient size and/or use of iterative reconstruction technique. COMPARISON:  None Available. FINDINGS: CT HEAD FINDINGS Brain: Cerebral ventricle sizes are concordant with the degree of cerebral volume loss. Patchy and confluent areas of decreased attenuation are noted throughout the deep and periventricular white matter of the cerebral hemispheres bilaterally, compatible with chronic microvascular ischemic disease. No evidence of large-territorial acute infarction. No parenchymal hemorrhage. No mass lesion. No extra-axial collection. No mass effect or midline shift. No hydrocephalus. Basilar cisterns are patent. Vascular: No hyperdense vessel. Skull: No acute fracture or focal lesion. Sinuses/Orbits: Paranasal sinuses and mastoid air cells are clear. Bilateral lens replacement. Otherwise the orbits are unremarkable. Other: 6 mm small left frontal scalp hematoma formation. CT CERVICAL SPINE FINDINGS Alignment: Grade 1 anterolisthesis of C2 on the C3, C3 on C4, C4 on C5. Skull base and  vertebrae: T1 and T3 vertebral body hemangioma. No acute fracture. No aggressive appearing focal osseous lesion or focal pathologic process. Soft tissues and spinal canal: No prevertebral fluid or swelling. No visible canal hematoma. Upper chest: Unremarkable. Other: Atherosclerotic plaque of the aortic arch and its branches. Retropharyngeal carotid arteries. IMPRESSION: 1. No acute intracranial abnormality. 2. No acute displaced fracture or traumatic listhesis of the cervical spine. Electronically Signed   By: Tish Frederickson M.D.   On: 01/18/2023 20:26   CT Cervical Spine Wo Contrast  Result Date: 01/18/2023 CLINICAL DATA:  Polytrauma, blunt EXAM: CT HEAD WITHOUT CONTRAST CT CERVICAL SPINE WITHOUT CONTRAST TECHNIQUE: Multidetector CT imaging of the head and cervical spine was performed following the standard protocol without intravenous contrast. Multiplanar CT image reconstructions of the cervical spine were also generated. RADIATION DOSE REDUCTION: This exam was performed according to the departmental dose-optimization program which includes automated exposure control, adjustment of the mA and/or kV according to patient size and/or use of iterative reconstruction technique. COMPARISON:  None Available. FINDINGS: CT HEAD FINDINGS Brain: Cerebral ventricle sizes are concordant with  the degree of cerebral volume loss. Patchy and confluent areas of decreased attenuation are noted throughout the deep and periventricular white matter of the cerebral hemispheres bilaterally, compatible with chronic microvascular ischemic disease. No evidence of large-territorial acute infarction. No parenchymal hemorrhage. No mass lesion. No extra-axial collection. No mass effect or midline shift. No hydrocephalus. Basilar cisterns are patent. Vascular: No hyperdense vessel. Skull: No acute fracture or focal lesion. Sinuses/Orbits: Paranasal sinuses and mastoid air cells are clear. Bilateral lens replacement. Otherwise the orbits  are unremarkable. Other: 6 mm small left frontal scalp hematoma formation. CT CERVICAL SPINE FINDINGS Alignment: Grade 1 anterolisthesis of C2 on the C3, C3 on C4, C4 on C5. Skull base and vertebrae: T1 and T3 vertebral body hemangioma. No acute fracture. No aggressive appearing focal osseous lesion or focal pathologic process. Soft tissues and spinal canal: No prevertebral fluid or swelling. No visible canal hematoma. Upper chest: Unremarkable. Other: Atherosclerotic plaque of the aortic arch and its branches. Retropharyngeal carotid arteries. IMPRESSION: 1. No acute intracranial abnormality. 2. No acute displaced fracture or traumatic listhesis of the cervical spine. Electronically Signed   By: Tish Frederickson M.D.   On: 01/18/2023 20:26   DG Wrist Complete Left  Result Date: 01/18/2023 CLINICAL DATA:  Trauma, fall EXAM: LEFT WRIST - COMPLETE 3+ VIEW COMPARISON:  03/26/2017 FINDINGS: There is previous internal fixation in distal radius with metallic plate and multiple screws. No recent displaced fracture or dislocation is seen. In the lateral view, small calcific densities are noted in the dorsal aspect of carpals. Osteopenia is seen in bony structures. IMPRESSION: No displaced fracture or dislocation is seen. Small calcific densities are seen in the dorsal aspect of carpals suggesting recent or old avulsion fracture. There is previous internal fixation in distal left radius. Electronically Signed   By: Ernie Avena M.D.   On: 01/18/2023 20:24    PROCEDURES:  Critical Care performed: No  ..Laceration Repair  Date/Time: 01/18/2023 9:39 PM  Performed by: Racheal Patches, PA-C Authorized by: Racheal Patches, PA-C   Consent:    Consent obtained:  Verbal   Consent given by:  Patient   Risks discussed:  Infection, pain, poor cosmetic result, poor wound healing and need for additional repair Universal protocol:    Procedure explained and questions answered to patient or proxy's  satisfaction: yes     Immediately prior to procedure, a time out was called: yes     Patient identity confirmed:  Verbally with patient Anesthesia:    Anesthesia method:  Local infiltration   Local anesthetic:  Lidocaine 1% w/o epi Laceration details:    Location:  Scalp   Scalp location:  L temporal (L forehead through L parietal region)   Length (cm):  15 Pre-procedure details:    Preparation:  Imaging obtained to evaluate for foreign bodies Exploration:    Hemostasis achieved with:  Direct pressure   Imaging outcome: foreign body not noted     Wound exploration: wound explored through full range of motion and entire depth of wound visualized     Wound extent: vascular damage     Wound extent: no foreign body, no nerve damage and no underlying fracture     Contaminated: no   Treatment:    Area cleansed with:  Povidone-iodine and saline   Amount of cleaning:  Extensive   Irrigation solution:  Sterile saline   Irrigation volume:  3L   Irrigation method:  Syringe   Layers/structures repaired:  Deep subcutaneous Deep subcutaneous:  Suture size:  6-0   Suture material:  Vicryl   Suture technique:  Running   Number of sutures:  1 (14 throws of running internal suture) Skin repair:    Repair method:  Sutures and staples   Suture size:  5-0   Suture material:  Nylon   Suture technique:  Running locked   Number of sutures:  1 (1 running suture with 15 throws)   Number of staples:  15 Repair type:    Repair type:  Complex Post-procedure details:    Dressing:  Open (no dressing)   Procedure completion:  Tolerated well, no immediate complications Comments:     Patient has extensive laceration involving the forehead through the parietal skull on the left side.  Skull was exposed.  Bleeding was ongoing but was controlled with direct pressure.  Patient had running suture, with internal and external as well as staples.  Forehead is sutured closed with a running suture and the scalp  within the hairline was closed with staples.    MEDICATIONS ORDERED IN ED: Medications  methocarbamol (ROBAXIN) tablet 500 mg (has no administration in time range)  predniSONE (DELTASONE) tablet 60 mg (has no administration in time range)  Tdap (BOOSTRIX) injection 0.5 mL (0.5 mLs Intramuscular Given 01/18/23 2012)  HYDROcodone-acetaminophen (NORCO/VICODIN) 5-325 MG per tablet 1 tablet (1 tablet Oral Given 01/18/23 2011)  lidocaine (PF) (XYLOCAINE) 1 % injection 15 mL (15 mLs Infiltration Given 01/18/23 2013)     IMPRESSION / MDM / ASSESSMENT AND PLAN / ED COURSE  I reviewed the triage vital signs and the nursing notes.                                 Differential diagnosis includes, but is not limited to, skull fracture, facial laceration, scalp laceration, intracranial hemorrhage, cervical spine injury, wrist fracture, and patellar fracture, compression fracture of the spine, herniated disc   Patient's presentation is most consistent with acute presentation with potential threat to life or bodily function.   Patient's diagnosis is consistent with fall, laceration of scalp and face, head injury with multiple contusions.  Patient presented to the emergency department after mechanical fall.  She fell in the kitchen, hitting her head on the dishwasher.  No loss of consciousness.  She was not on blood thinners.  She sustained a pretty significant soft tissue injury to the forehead extending into the scalp.  This was closed as described above.  Imaging is reassuring in regards to no intracranial hemorrhage, skull fracture, other fractures.  Patient remains neurologically intact.  Patient be placed on antibiotics prophylactically for large laceration, steroid, muscle relaxer for musculoskeletal pain with limited pain medication as well.  Follow-up primary care for suture removal.  Return precautions discussed with the patient..  Patient is given ED precautions to return to the ED for any worsening  or new symptoms.     FINAL CLINICAL IMPRESSION(S) / ED DIAGNOSES   Final diagnoses:  Fall, initial encounter  Laceration of scalp, initial encounter  Injury of head, initial encounter  Multiple contusions     Rx / DC Orders   ED Discharge Orders          Ordered    oxyCODONE-acetaminophen (PERCOCET/ROXICET) 5-325 MG tablet  Every 6 hours PRN        01/18/23 2130    cephALEXin (KEFLEX) 500 MG capsule  2 times daily  01/18/23 2130    methocarbamol (ROBAXIN) 500 MG tablet  4 times daily        01/18/23 2130    predniSONE (DELTASONE) 50 MG tablet  Daily with breakfast        01/18/23 2130             Note:  This document was prepared using Dragon voice recognition software and may include unintentional dictation errors.   Lanette Hampshire 01/18/23 2145    Trinna Post, MD 01/20/23 [phone]

## 2023-01-30 DIAGNOSIS — M549 Dorsalgia, unspecified: Secondary | ICD-10-CM | POA: Diagnosis not present

## 2023-01-30 DIAGNOSIS — E1169 Type 2 diabetes mellitus with other specified complication: Secondary | ICD-10-CM | POA: Diagnosis not present

## 2023-01-30 DIAGNOSIS — E1165 Type 2 diabetes mellitus with hyperglycemia: Secondary | ICD-10-CM | POA: Diagnosis not present

## 2023-01-30 DIAGNOSIS — Y92009 Unspecified place in unspecified non-institutional (private) residence as the place of occurrence of the external cause: Secondary | ICD-10-CM | POA: Diagnosis not present

## 2023-01-30 DIAGNOSIS — F3342 Major depressive disorder, recurrent, in full remission: Secondary | ICD-10-CM | POA: Diagnosis not present

## 2023-01-30 DIAGNOSIS — Z794 Long term (current) use of insulin: Secondary | ICD-10-CM | POA: Diagnosis not present

## 2023-01-30 DIAGNOSIS — S0101XD Laceration without foreign body of scalp, subsequent encounter: Secondary | ICD-10-CM | POA: Diagnosis not present

## 2023-01-30 DIAGNOSIS — G319 Degenerative disease of nervous system, unspecified: Secondary | ICD-10-CM | POA: Diagnosis not present

## 2023-01-30 DIAGNOSIS — G8929 Other chronic pain: Secondary | ICD-10-CM | POA: Diagnosis not present

## 2023-01-30 DIAGNOSIS — W010XXA Fall on same level from slipping, tripping and stumbling without subsequent striking against object, initial encounter: Secondary | ICD-10-CM | POA: Diagnosis not present

## 2023-01-30 DIAGNOSIS — E669 Obesity, unspecified: Secondary | ICD-10-CM | POA: Diagnosis not present

## 2023-02-03 DIAGNOSIS — Z7984 Long term (current) use of oral hypoglycemic drugs: Secondary | ICD-10-CM | POA: Diagnosis not present

## 2023-02-03 DIAGNOSIS — Z79891 Long term (current) use of opiate analgesic: Secondary | ICD-10-CM | POA: Diagnosis not present

## 2023-02-03 DIAGNOSIS — F419 Anxiety disorder, unspecified: Secondary | ICD-10-CM | POA: Diagnosis not present

## 2023-02-03 DIAGNOSIS — F3342 Major depressive disorder, recurrent, in full remission: Secondary | ICD-10-CM | POA: Diagnosis not present

## 2023-02-03 DIAGNOSIS — Z7985 Long-term (current) use of injectable non-insulin antidiabetic drugs: Secondary | ICD-10-CM | POA: Diagnosis not present

## 2023-02-03 DIAGNOSIS — W19XXXD Unspecified fall, subsequent encounter: Secondary | ICD-10-CM | POA: Diagnosis not present

## 2023-02-03 DIAGNOSIS — Z6837 Body mass index (BMI) 37.0-37.9, adult: Secondary | ICD-10-CM | POA: Diagnosis not present

## 2023-02-03 DIAGNOSIS — E1169 Type 2 diabetes mellitus with other specified complication: Secondary | ICD-10-CM | POA: Diagnosis not present

## 2023-02-03 DIAGNOSIS — J4489 Other specified chronic obstructive pulmonary disease: Secondary | ICD-10-CM | POA: Diagnosis not present

## 2023-02-03 DIAGNOSIS — G8929 Other chronic pain: Secondary | ICD-10-CM | POA: Diagnosis not present

## 2023-02-03 DIAGNOSIS — K219 Gastro-esophageal reflux disease without esophagitis: Secondary | ICD-10-CM | POA: Diagnosis not present

## 2023-02-03 DIAGNOSIS — E669 Obesity, unspecified: Secondary | ICD-10-CM | POA: Diagnosis not present

## 2023-02-03 DIAGNOSIS — I10 Essential (primary) hypertension: Secondary | ICD-10-CM | POA: Diagnosis not present

## 2023-02-03 DIAGNOSIS — G319 Degenerative disease of nervous system, unspecified: Secondary | ICD-10-CM | POA: Diagnosis not present

## 2023-02-03 DIAGNOSIS — E1165 Type 2 diabetes mellitus with hyperglycemia: Secondary | ICD-10-CM | POA: Diagnosis not present

## 2023-02-03 DIAGNOSIS — Z602 Problems related to living alone: Secondary | ICD-10-CM | POA: Diagnosis not present

## 2023-02-03 DIAGNOSIS — E538 Deficiency of other specified B group vitamins: Secondary | ICD-10-CM | POA: Diagnosis not present

## 2023-02-03 DIAGNOSIS — D509 Iron deficiency anemia, unspecified: Secondary | ICD-10-CM | POA: Diagnosis not present

## 2023-02-03 DIAGNOSIS — Z87891 Personal history of nicotine dependence: Secondary | ICD-10-CM | POA: Diagnosis not present

## 2023-02-03 DIAGNOSIS — S0101XD Laceration without foreign body of scalp, subsequent encounter: Secondary | ICD-10-CM | POA: Diagnosis not present

## 2023-02-03 DIAGNOSIS — Z90710 Acquired absence of both cervix and uterus: Secondary | ICD-10-CM | POA: Diagnosis not present

## 2023-02-03 DIAGNOSIS — J439 Emphysema, unspecified: Secondary | ICD-10-CM | POA: Diagnosis not present

## 2023-02-03 DIAGNOSIS — M549 Dorsalgia, unspecified: Secondary | ICD-10-CM | POA: Diagnosis not present

## 2023-02-03 DIAGNOSIS — Z794 Long term (current) use of insulin: Secondary | ICD-10-CM | POA: Diagnosis not present

## 2023-02-14 DIAGNOSIS — J4489 Other specified chronic obstructive pulmonary disease: Secondary | ICD-10-CM | POA: Diagnosis not present

## 2023-02-14 DIAGNOSIS — E1165 Type 2 diabetes mellitus with hyperglycemia: Secondary | ICD-10-CM | POA: Diagnosis not present

## 2023-02-14 DIAGNOSIS — G8929 Other chronic pain: Secondary | ICD-10-CM | POA: Diagnosis not present

## 2023-02-14 DIAGNOSIS — J439 Emphysema, unspecified: Secondary | ICD-10-CM | POA: Diagnosis not present

## 2023-02-14 DIAGNOSIS — I10 Essential (primary) hypertension: Secondary | ICD-10-CM | POA: Diagnosis not present

## 2023-02-14 DIAGNOSIS — M549 Dorsalgia, unspecified: Secondary | ICD-10-CM | POA: Diagnosis not present

## 2023-02-14 DIAGNOSIS — S0101XD Laceration without foreign body of scalp, subsequent encounter: Secondary | ICD-10-CM | POA: Diagnosis not present

## 2023-02-16 DIAGNOSIS — I10 Essential (primary) hypertension: Secondary | ICD-10-CM | POA: Diagnosis not present

## 2023-02-16 DIAGNOSIS — S0101XD Laceration without foreign body of scalp, subsequent encounter: Secondary | ICD-10-CM | POA: Diagnosis not present

## 2023-02-16 DIAGNOSIS — G8929 Other chronic pain: Secondary | ICD-10-CM | POA: Diagnosis not present

## 2023-02-16 DIAGNOSIS — J449 Chronic obstructive pulmonary disease, unspecified: Secondary | ICD-10-CM | POA: Diagnosis not present

## 2023-02-16 DIAGNOSIS — M5489 Other dorsalgia: Secondary | ICD-10-CM | POA: Diagnosis not present

## 2023-02-16 DIAGNOSIS — J439 Emphysema, unspecified: Secondary | ICD-10-CM | POA: Diagnosis not present

## 2023-02-16 DIAGNOSIS — E1165 Type 2 diabetes mellitus with hyperglycemia: Secondary | ICD-10-CM | POA: Diagnosis not present

## 2023-02-22 DIAGNOSIS — R0781 Pleurodynia: Secondary | ICD-10-CM | POA: Diagnosis not present

## 2023-02-22 DIAGNOSIS — M5489 Other dorsalgia: Secondary | ICD-10-CM | POA: Diagnosis not present

## 2023-02-27 ENCOUNTER — Other Ambulatory Visit: Payer: Self-pay | Admitting: Physician Assistant

## 2023-02-27 DIAGNOSIS — S22060A Wedge compression fracture of T7-T8 vertebra, initial encounter for closed fracture: Secondary | ICD-10-CM

## 2023-02-28 ENCOUNTER — Ambulatory Visit
Admission: RE | Admit: 2023-02-28 | Discharge: 2023-02-28 | Disposition: A | Discharge status: Home / Self Care | Payer: PPO | Source: Ambulatory Visit | Attending: Physician Assistant | Admitting: Physician Assistant

## 2023-02-28 DIAGNOSIS — S22060A Wedge compression fracture of T7-T8 vertebra, initial encounter for closed fracture: Secondary | ICD-10-CM | POA: Insufficient documentation

## 2023-02-28 DIAGNOSIS — M4854XA Collapsed vertebra, not elsewhere classified, thoracic region, initial encounter for fracture: Secondary | ICD-10-CM | POA: Diagnosis not present

## 2023-02-28 DIAGNOSIS — M5134 Other intervertebral disc degeneration, thoracic region: Secondary | ICD-10-CM | POA: Diagnosis not present

## 2023-02-28 DIAGNOSIS — R29890 Loss of height: Secondary | ICD-10-CM | POA: Diagnosis not present

## 2023-04-11 DIAGNOSIS — S22060A Wedge compression fracture of T7-T8 vertebra, initial encounter for closed fracture: Secondary | ICD-10-CM | POA: Diagnosis not present

## 2023-04-14 DIAGNOSIS — E559 Vitamin D deficiency, unspecified: Secondary | ICD-10-CM | POA: Diagnosis not present

## 2023-04-14 DIAGNOSIS — F3342 Major depressive disorder, recurrent, in full remission: Secondary | ICD-10-CM | POA: Diagnosis not present

## 2023-04-14 DIAGNOSIS — D509 Iron deficiency anemia, unspecified: Secondary | ICD-10-CM | POA: Diagnosis not present

## 2023-04-14 DIAGNOSIS — E785 Hyperlipidemia, unspecified: Secondary | ICD-10-CM | POA: Diagnosis not present

## 2023-04-14 DIAGNOSIS — G8929 Other chronic pain: Secondary | ICD-10-CM | POA: Diagnosis not present

## 2023-04-14 DIAGNOSIS — E1169 Type 2 diabetes mellitus with other specified complication: Secondary | ICD-10-CM | POA: Diagnosis not present

## 2023-04-14 DIAGNOSIS — I70201 Unspecified atherosclerosis of native arteries of extremities, right leg: Secondary | ICD-10-CM | POA: Diagnosis not present

## 2023-04-14 DIAGNOSIS — E1151 Type 2 diabetes mellitus with diabetic peripheral angiopathy without gangrene: Secondary | ICD-10-CM | POA: Diagnosis not present

## 2023-04-14 DIAGNOSIS — J439 Emphysema, unspecified: Secondary | ICD-10-CM | POA: Diagnosis not present

## 2023-04-14 DIAGNOSIS — E538 Deficiency of other specified B group vitamins: Secondary | ICD-10-CM | POA: Diagnosis not present

## 2023-04-14 DIAGNOSIS — Z794 Long term (current) use of insulin: Secondary | ICD-10-CM | POA: Diagnosis not present

## 2023-05-03 ENCOUNTER — Other Ambulatory Visit: Payer: Self-pay | Admitting: Internal Medicine

## 2023-05-03 DIAGNOSIS — Z1231 Encounter for screening mammogram for malignant neoplasm of breast: Secondary | ICD-10-CM

## 2023-05-05 ENCOUNTER — Ambulatory Visit
Admission: RE | Admit: 2023-05-05 | Discharge: 2023-05-05 | Disposition: A | Discharge status: Home / Self Care | Payer: PPO | Source: Ambulatory Visit | Attending: Internal Medicine | Admitting: Internal Medicine

## 2023-05-05 DIAGNOSIS — Z1231 Encounter for screening mammogram for malignant neoplasm of breast: Secondary | ICD-10-CM | POA: Diagnosis not present

## 2023-05-08 DIAGNOSIS — E7849 Other hyperlipidemia: Secondary | ICD-10-CM | POA: Diagnosis not present

## 2023-05-08 DIAGNOSIS — D509 Iron deficiency anemia, unspecified: Secondary | ICD-10-CM | POA: Diagnosis not present

## 2023-05-08 DIAGNOSIS — E1165 Type 2 diabetes mellitus with hyperglycemia: Secondary | ICD-10-CM | POA: Diagnosis not present

## 2023-05-08 DIAGNOSIS — Z794 Long term (current) use of insulin: Secondary | ICD-10-CM | POA: Diagnosis not present

## 2023-05-10 DIAGNOSIS — Z961 Presence of intraocular lens: Secondary | ICD-10-CM | POA: Diagnosis not present

## 2023-05-10 DIAGNOSIS — H353131 Nonexudative age-related macular degeneration, bilateral, early dry stage: Secondary | ICD-10-CM | POA: Diagnosis not present

## 2023-05-10 DIAGNOSIS — E119 Type 2 diabetes mellitus without complications: Secondary | ICD-10-CM | POA: Diagnosis not present

## 2023-05-10 DIAGNOSIS — H02831 Dermatochalasis of right upper eyelid: Secondary | ICD-10-CM | POA: Diagnosis not present

## 2023-05-10 DIAGNOSIS — H43813 Vitreous degeneration, bilateral: Secondary | ICD-10-CM | POA: Diagnosis not present

## 2023-05-15 DIAGNOSIS — G4733 Obstructive sleep apnea (adult) (pediatric): Secondary | ICD-10-CM | POA: Diagnosis not present

## 2023-05-15 DIAGNOSIS — E1165 Type 2 diabetes mellitus with hyperglycemia: Secondary | ICD-10-CM | POA: Diagnosis not present

## 2023-05-15 DIAGNOSIS — G319 Degenerative disease of nervous system, unspecified: Secondary | ICD-10-CM | POA: Diagnosis not present

## 2023-05-15 DIAGNOSIS — Z0000 Encounter for general adult medical examination without abnormal findings: Secondary | ICD-10-CM | POA: Diagnosis not present

## 2023-05-15 DIAGNOSIS — Z6837 Body mass index (BMI) 37.0-37.9, adult: Secondary | ICD-10-CM | POA: Diagnosis not present

## 2023-05-15 DIAGNOSIS — Z794 Long term (current) use of insulin: Secondary | ICD-10-CM | POA: Diagnosis not present

## 2023-05-15 DIAGNOSIS — K219 Gastro-esophageal reflux disease without esophagitis: Secondary | ICD-10-CM | POA: Diagnosis not present

## 2023-05-15 DIAGNOSIS — E119 Type 2 diabetes mellitus without complications: Secondary | ICD-10-CM | POA: Diagnosis not present

## 2023-05-15 DIAGNOSIS — I10 Essential (primary) hypertension: Secondary | ICD-10-CM | POA: Diagnosis not present

## 2023-05-15 DIAGNOSIS — J438 Other emphysema: Secondary | ICD-10-CM | POA: Diagnosis not present

## 2023-05-15 DIAGNOSIS — E669 Obesity, unspecified: Secondary | ICD-10-CM | POA: Diagnosis not present

## 2023-05-15 DIAGNOSIS — F3342 Major depressive disorder, recurrent, in full remission: Secondary | ICD-10-CM | POA: Diagnosis not present

## 2023-05-15 DIAGNOSIS — E7849 Other hyperlipidemia: Secondary | ICD-10-CM | POA: Diagnosis not present

## 2023-05-15 DIAGNOSIS — D509 Iron deficiency anemia, unspecified: Secondary | ICD-10-CM | POA: Diagnosis not present

## 2023-05-15 DIAGNOSIS — E538 Deficiency of other specified B group vitamins: Secondary | ICD-10-CM | POA: Diagnosis not present

## 2023-05-17 ENCOUNTER — Other Ambulatory Visit: Payer: Self-pay | Admitting: Internal Medicine

## 2023-05-17 DIAGNOSIS — S22000S Wedge compression fracture of unspecified thoracic vertebra, sequela: Secondary | ICD-10-CM

## 2023-05-25 ENCOUNTER — Ambulatory Visit
Admission: RE | Admit: 2023-05-25 | Discharge: 2023-05-25 | Disposition: A | Discharge status: Home / Self Care | Payer: PPO | Source: Ambulatory Visit | Attending: Internal Medicine | Admitting: Internal Medicine

## 2023-05-25 DIAGNOSIS — S22000S Wedge compression fracture of unspecified thoracic vertebra, sequela: Secondary | ICD-10-CM | POA: Insufficient documentation

## 2023-05-25 DIAGNOSIS — M4804 Spinal stenosis, thoracic region: Secondary | ICD-10-CM | POA: Diagnosis not present

## 2023-05-25 DIAGNOSIS — M5134 Other intervertebral disc degeneration, thoracic region: Secondary | ICD-10-CM | POA: Diagnosis not present

## 2023-05-25 DIAGNOSIS — M419 Scoliosis, unspecified: Secondary | ICD-10-CM | POA: Diagnosis not present

## 2023-05-25 DIAGNOSIS — M4854XA Collapsed vertebra, not elsewhere classified, thoracic region, initial encounter for fracture: Secondary | ICD-10-CM | POA: Diagnosis not present

## 2023-06-13 DIAGNOSIS — M5380 Other specified dorsopathies, site unspecified: Secondary | ICD-10-CM | POA: Diagnosis not present

## 2023-06-13 DIAGNOSIS — S22060A Wedge compression fracture of T7-T8 vertebra, initial encounter for closed fracture: Secondary | ICD-10-CM | POA: Diagnosis not present

## 2023-06-16 DIAGNOSIS — M546 Pain in thoracic spine: Secondary | ICD-10-CM | POA: Diagnosis not present

## 2023-06-16 DIAGNOSIS — R2681 Unsteadiness on feet: Secondary | ICD-10-CM | POA: Diagnosis not present

## 2023-06-16 DIAGNOSIS — M545 Low back pain, unspecified: Secondary | ICD-10-CM | POA: Diagnosis not present

## 2023-06-20 DIAGNOSIS — M545 Low back pain, unspecified: Secondary | ICD-10-CM | POA: Diagnosis not present

## 2023-06-20 DIAGNOSIS — M546 Pain in thoracic spine: Secondary | ICD-10-CM | POA: Diagnosis not present

## 2023-06-20 DIAGNOSIS — R2681 Unsteadiness on feet: Secondary | ICD-10-CM | POA: Diagnosis not present

## 2023-06-26 DIAGNOSIS — R2681 Unsteadiness on feet: Secondary | ICD-10-CM | POA: Diagnosis not present

## 2023-06-26 DIAGNOSIS — M545 Low back pain, unspecified: Secondary | ICD-10-CM | POA: Diagnosis not present

## 2023-06-26 DIAGNOSIS — M546 Pain in thoracic spine: Secondary | ICD-10-CM | POA: Diagnosis not present

## 2023-06-28 DIAGNOSIS — J4520 Mild intermittent asthma, uncomplicated: Secondary | ICD-10-CM | POA: Diagnosis not present

## 2023-06-28 DIAGNOSIS — G4733 Obstructive sleep apnea (adult) (pediatric): Secondary | ICD-10-CM | POA: Diagnosis not present

## 2023-06-30 DIAGNOSIS — M546 Pain in thoracic spine: Secondary | ICD-10-CM | POA: Diagnosis not present

## 2023-06-30 DIAGNOSIS — R2681 Unsteadiness on feet: Secondary | ICD-10-CM | POA: Diagnosis not present

## 2023-06-30 DIAGNOSIS — M545 Low back pain, unspecified: Secondary | ICD-10-CM | POA: Diagnosis not present

## 2023-07-04 DIAGNOSIS — M545 Low back pain, unspecified: Secondary | ICD-10-CM | POA: Diagnosis not present

## 2023-07-04 DIAGNOSIS — M546 Pain in thoracic spine: Secondary | ICD-10-CM | POA: Diagnosis not present

## 2023-07-04 DIAGNOSIS — R2681 Unsteadiness on feet: Secondary | ICD-10-CM | POA: Diagnosis not present

## 2023-07-07 DIAGNOSIS — M545 Low back pain, unspecified: Secondary | ICD-10-CM | POA: Diagnosis not present

## 2023-07-07 DIAGNOSIS — R2681 Unsteadiness on feet: Secondary | ICD-10-CM | POA: Diagnosis not present

## 2023-07-07 DIAGNOSIS — M546 Pain in thoracic spine: Secondary | ICD-10-CM | POA: Diagnosis not present

## 2023-07-11 DIAGNOSIS — M546 Pain in thoracic spine: Secondary | ICD-10-CM | POA: Diagnosis not present

## 2023-07-11 DIAGNOSIS — M545 Low back pain, unspecified: Secondary | ICD-10-CM | POA: Diagnosis not present

## 2023-07-11 DIAGNOSIS — R2681 Unsteadiness on feet: Secondary | ICD-10-CM | POA: Diagnosis not present

## 2023-07-13 DIAGNOSIS — M546 Pain in thoracic spine: Secondary | ICD-10-CM | POA: Diagnosis not present

## 2023-07-13 DIAGNOSIS — R2681 Unsteadiness on feet: Secondary | ICD-10-CM | POA: Diagnosis not present

## 2023-07-13 DIAGNOSIS — M545 Low back pain, unspecified: Secondary | ICD-10-CM | POA: Diagnosis not present

## 2023-07-17 DIAGNOSIS — M546 Pain in thoracic spine: Secondary | ICD-10-CM | POA: Diagnosis not present

## 2023-07-17 DIAGNOSIS — R2681 Unsteadiness on feet: Secondary | ICD-10-CM | POA: Diagnosis not present

## 2023-07-17 DIAGNOSIS — M545 Low back pain, unspecified: Secondary | ICD-10-CM | POA: Diagnosis not present

## 2023-08-08 ENCOUNTER — Other Ambulatory Visit: Payer: Self-pay | Admitting: Family Medicine

## 2023-08-08 ENCOUNTER — Ambulatory Visit
Admission: RE | Admit: 2023-08-08 | Discharge: 2023-08-08 | Disposition: A | Discharge status: Home / Self Care | Payer: PPO | Source: Ambulatory Visit | Attending: Family Medicine | Admitting: Family Medicine

## 2023-08-08 DIAGNOSIS — M546 Pain in thoracic spine: Secondary | ICD-10-CM | POA: Diagnosis not present

## 2023-08-08 DIAGNOSIS — S22060A Wedge compression fracture of T7-T8 vertebra, initial encounter for closed fracture: Secondary | ICD-10-CM

## 2023-08-08 DIAGNOSIS — M5380 Other specified dorsopathies, site unspecified: Secondary | ICD-10-CM | POA: Diagnosis not present

## 2023-08-08 DIAGNOSIS — R29890 Loss of height: Secondary | ICD-10-CM | POA: Diagnosis not present

## 2023-08-08 DIAGNOSIS — M4804 Spinal stenosis, thoracic region: Secondary | ICD-10-CM | POA: Diagnosis not present

## 2023-08-08 DIAGNOSIS — I700 Atherosclerosis of aorta: Secondary | ICD-10-CM | POA: Diagnosis not present

## 2023-08-09 DIAGNOSIS — M545 Low back pain, unspecified: Secondary | ICD-10-CM | POA: Diagnosis not present

## 2023-08-09 DIAGNOSIS — M546 Pain in thoracic spine: Secondary | ICD-10-CM | POA: Diagnosis not present

## 2023-08-09 DIAGNOSIS — R2681 Unsteadiness on feet: Secondary | ICD-10-CM | POA: Diagnosis not present

## 2023-08-11 DIAGNOSIS — M546 Pain in thoracic spine: Secondary | ICD-10-CM | POA: Diagnosis not present

## 2023-08-11 DIAGNOSIS — M545 Low back pain, unspecified: Secondary | ICD-10-CM | POA: Diagnosis not present

## 2023-08-11 DIAGNOSIS — R2681 Unsteadiness on feet: Secondary | ICD-10-CM | POA: Diagnosis not present

## 2023-08-22 DIAGNOSIS — R2681 Unsteadiness on feet: Secondary | ICD-10-CM | POA: Diagnosis not present

## 2023-08-22 DIAGNOSIS — M546 Pain in thoracic spine: Secondary | ICD-10-CM | POA: Diagnosis not present

## 2023-08-22 DIAGNOSIS — M545 Low back pain, unspecified: Secondary | ICD-10-CM | POA: Diagnosis not present

## 2023-08-24 DIAGNOSIS — M545 Low back pain, unspecified: Secondary | ICD-10-CM | POA: Diagnosis not present

## 2023-08-24 DIAGNOSIS — M546 Pain in thoracic spine: Secondary | ICD-10-CM | POA: Diagnosis not present

## 2023-08-24 DIAGNOSIS — R2681 Unsteadiness on feet: Secondary | ICD-10-CM | POA: Diagnosis not present

## 2023-08-29 DIAGNOSIS — R2681 Unsteadiness on feet: Secondary | ICD-10-CM | POA: Diagnosis not present

## 2023-08-29 DIAGNOSIS — M546 Pain in thoracic spine: Secondary | ICD-10-CM | POA: Diagnosis not present

## 2023-08-29 DIAGNOSIS — M545 Low back pain, unspecified: Secondary | ICD-10-CM | POA: Diagnosis not present

## 2023-08-31 DIAGNOSIS — M545 Low back pain, unspecified: Secondary | ICD-10-CM | POA: Diagnosis not present

## 2023-08-31 DIAGNOSIS — M546 Pain in thoracic spine: Secondary | ICD-10-CM | POA: Diagnosis not present

## 2023-08-31 DIAGNOSIS — R2681 Unsteadiness on feet: Secondary | ICD-10-CM | POA: Diagnosis not present

## 2023-09-04 DIAGNOSIS — M546 Pain in thoracic spine: Secondary | ICD-10-CM | POA: Diagnosis not present

## 2023-09-04 DIAGNOSIS — R2681 Unsteadiness on feet: Secondary | ICD-10-CM | POA: Diagnosis not present

## 2023-09-04 DIAGNOSIS — M545 Low back pain, unspecified: Secondary | ICD-10-CM | POA: Diagnosis not present

## 2023-09-06 DIAGNOSIS — M545 Low back pain, unspecified: Secondary | ICD-10-CM | POA: Diagnosis not present

## 2023-09-06 DIAGNOSIS — R2681 Unsteadiness on feet: Secondary | ICD-10-CM | POA: Diagnosis not present

## 2023-09-06 DIAGNOSIS — M546 Pain in thoracic spine: Secondary | ICD-10-CM | POA: Diagnosis not present

## 2023-09-11 DIAGNOSIS — M546 Pain in thoracic spine: Secondary | ICD-10-CM | POA: Diagnosis not present

## 2023-09-11 DIAGNOSIS — M545 Low back pain, unspecified: Secondary | ICD-10-CM | POA: Diagnosis not present

## 2023-09-11 DIAGNOSIS — R2681 Unsteadiness on feet: Secondary | ICD-10-CM | POA: Diagnosis not present

## 2023-09-14 ENCOUNTER — Emergency Department: Payer: PPO

## 2023-09-14 ENCOUNTER — Encounter: Payer: Self-pay | Admitting: *Deleted

## 2023-09-14 ENCOUNTER — Emergency Department
Admission: EM | Admit: 2023-09-14 | Discharge: 2023-09-14 | Disposition: A | Discharge status: Home / Self Care | Payer: PPO | Attending: Emergency Medicine | Admitting: Emergency Medicine

## 2023-09-14 ENCOUNTER — Other Ambulatory Visit: Payer: Self-pay

## 2023-09-14 DIAGNOSIS — Y9301 Activity, walking, marching and hiking: Secondary | ICD-10-CM | POA: Insufficient documentation

## 2023-09-14 DIAGNOSIS — W01198A Fall on same level from slipping, tripping and stumbling with subsequent striking against other object, initial encounter: Secondary | ICD-10-CM | POA: Diagnosis not present

## 2023-09-14 DIAGNOSIS — J449 Chronic obstructive pulmonary disease, unspecified: Secondary | ICD-10-CM | POA: Insufficient documentation

## 2023-09-14 DIAGNOSIS — E119 Type 2 diabetes mellitus without complications: Secondary | ICD-10-CM | POA: Diagnosis not present

## 2023-09-14 DIAGNOSIS — I10 Essential (primary) hypertension: Secondary | ICD-10-CM | POA: Diagnosis not present

## 2023-09-14 DIAGNOSIS — S0231XA Fracture of orbital floor, right side, initial encounter for closed fracture: Secondary | ICD-10-CM | POA: Insufficient documentation

## 2023-09-14 DIAGNOSIS — M25532 Pain in left wrist: Secondary | ICD-10-CM | POA: Diagnosis not present

## 2023-09-14 DIAGNOSIS — S0083XA Contusion of other part of head, initial encounter: Secondary | ICD-10-CM | POA: Diagnosis not present

## 2023-09-14 DIAGNOSIS — Z4789 Encounter for other orthopedic aftercare: Secondary | ICD-10-CM | POA: Diagnosis not present

## 2023-09-14 DIAGNOSIS — S0511XA Contusion of eyeball and orbital tissues, right eye, initial encounter: Secondary | ICD-10-CM | POA: Diagnosis not present

## 2023-09-14 DIAGNOSIS — M1812 Unilateral primary osteoarthritis of first carpometacarpal joint, left hand: Secondary | ICD-10-CM | POA: Diagnosis not present

## 2023-09-14 DIAGNOSIS — R936 Abnormal findings on diagnostic imaging of limbs: Secondary | ICD-10-CM | POA: Diagnosis not present

## 2023-09-14 DIAGNOSIS — S0291XA Unspecified fracture of skull, initial encounter for closed fracture: Secondary | ICD-10-CM | POA: Diagnosis not present

## 2023-09-14 DIAGNOSIS — S0993XA Unspecified injury of face, initial encounter: Secondary | ICD-10-CM | POA: Diagnosis present

## 2023-09-14 MED ORDER — CEPHALEXIN 500 MG PO CAPS: 500.0000 mg | ORAL_CAPSULE | Freq: Four times a day (QID) | ORAL | 0 refills | Status: AC

## 2023-09-14 MED ORDER — CEPHALEXIN 500 MG PO CAPS
500.0000 mg | ORAL_CAPSULE | Freq: Once | ORAL | Status: AC
  Administered 2023-09-14: 500 mg via ORAL
  Filled 2023-09-14: qty 1

## 2023-09-14 MED ORDER — OXYMETAZOLINE HCL 0.05 % NA SOLN
2.0000 | Freq: Once | NASAL | Status: AC
  Administered 2023-09-14: 2 via NASAL
  Filled 2023-09-14: qty 30

## 2023-09-14 NOTE — ED Provider Triage Note (Signed)
Emergency Medicine Provider Triage Evaluation Note  Isabella White, a 78 y.o. female  was evaluated in triage.  Pt complains of mechanical fall. She tripped while using her single-point can, resulting in a right forehead contusion, right forearm hematoma, right knee abrasion, and left wrist pain and swelling. She denies LOC, NV or weakness  Review of Systems  Positive: Fall, facial contusion, left wrist pain Negative: LOC  Physical Exam  BP (!) 162/73 (BP Location: Right Arm)   Pulse 87   Temp 98.3 F (36.8 C) (Oral)   Resp 18   Ht 5\' 1"  (1.549 m)   Wt 83.9 kg   SpO2 94%   BMI 34.96 kg/m  Gen:   Awake, no distress  NAD Resp:  Normal effort  MSK:   Moves extremities without difficulty  Other:  CN II-XII grossly intact  Medical Decision Making  Medically screening exam initiated at 4:52 PM.  Appropriate orders placed.  Isabella White was informed that the remainder of the evaluation will be completed by another provider, this initial triage assessment does not replace that evaluation, and the importance of remaining in the ED until their evaluation is complete.  Patient with mechanical fall resulting in facial contusion and left wrist pain. She denies LOC.   Lissa Hoard, PA-C 09/14/23 1700

## 2023-09-14 NOTE — ED Provider Notes (Signed)
[facility] Provider Note    Event Date/Time   First MD Initiated Contact with Patient 09/14/23 1740     (approximate)   History   Fall   HPI  Isabella White is a 78 y.o. female with a history of COPD, diabetes, hypertension, hyperlipidemia, and GERD who presents with a head injury after she was walking earlier today and had a mechanical fall due to tripping.  She fell forward and hit the right side of her face.  She did not lose consciousness.  She denies any headache or vomiting.  She reports swelling around the right eye.  She has no double vision.  She also developed a minor nosebleed this afternoon from the right nare.  She has had bleeding from that near previously.  She denies any neck or back pain.  She does report injuring her left wrist and scraping her right knee, however she has been able to walk and bear weight without difficulty.  I reviewed the past medical records.  The patient's most recent outpatient encounter was follow-up with physical medicine on 12/17.  She has no recent ED visits or hospitalizations.   Physical Exam   Triage Vital Signs: ED Triage Vitals  Encounter Vitals Group     BP 09/14/23 1629 (!) 162/73     Systolic BP Percentile --      Diastolic BP Percentile --      Pulse Rate 09/14/23 1629 87     Resp 09/14/23 1629 18     Temp 09/14/23 1629 98.3 F (36.8 C)     Temp Source 09/14/23 1629 Oral     SpO2 09/14/23 1629 94 %     Weight 09/14/23 1636 185 lb (83.9 kg)     Height 09/14/23 1636 5\' 1"  (1.549 m)     Head Circumference --      Peak Flow --      Pain Score 09/14/23 1636 2     Pain Loc --      Pain Education --      Exclude from Growth Chart --     Most recent vital signs: Vitals:   09/14/23 1629  BP: (!) 162/73  Pulse: 87  Resp: 18  Temp: 98.3 F (36.8 C)  SpO2: 94%     General: Alert, no distress.  CV:  Good peripheral perfusion.  Resp:  Normal effort.  Abd:  No distention.  Other:  EOMI.   PERRLA.  No photophobia.  No facial droop.  Right periorbital and eyelid ecchymosis and swelling.  Motor intact in all extremities.  Normal speech.  Stigmata of recent bleeding in the anterior right nare.  No active hemorrhage.  No midline spinal tenderness.  Neck supple, full ROM.  Mild superficial abrasion to right knee with full range of motion and no bony tenderness.  Mild swelling to the left wrist with no significant tenderness.  Full range of motion.  Motor and sensory intact in median, radial, and ulnar distributions.   ED Results / Procedures / Treatments   Labs (all labs ordered are listed, but only abnormal results are displayed) Labs Reviewed - No data to display   EKG     RADIOLOGY  CT head/cervical spine/maxillofacial: I independently viewed and interpreted the images; there is no ICH.  Radiology report indicates the following:  IMPRESSION:  1. No acute intracranial pathology.  2. Minimally depressed blowout fracture of the right orbital floor,  depression no greater than 0.2 cm. No significant  fat or rectus  herniation.  3. Hemorrhage within the right maxillary sinus.  4. Soft tissue contusion of the right forehead and brow.  5. No fracture or static subluxation of the cervical spine.   XR L wrist: No acute fracture   PROCEDURES:  Critical Care performed: No  Procedures   MEDICATIONS ORDERED IN ED: Medications  cephALEXin (KEFLEX) capsule 500 mg (has no administration in time range)  oxymetazoline (AFRIN) 0.05 % nasal spray 2 spray (2 sprays Right Nare Given 09/14/23 1918)     IMPRESSION / MDM / ASSESSMENT AND PLAN / ED COURSE  I reviewed the triage vital signs and the nursing notes.   Differential diagnosis includes, but is not limited to, minor head injury, concussion, ICH, facial contusion, maxillofacial fracture.  Patient's presentation is most consistent with acute presentation with potential threat to life or bodily function.  CT head and  cervical spine are negative.  CT maxillofacial shows a right orbital floor blowout fracture.  X-rays of the wrist are negative.  There is no indication for imaging of the knee.  The patient has a mild intermittent anterior nosebleed on the right but this is currently not actively bleeding.  I consulted and discussed the case with Dr. Rolley Sims from ophthalmology who recommended antibiotics and follow-up in my clinic next week as well as precautions against blowing her nose.  I confirmed that the patient had a tetanus booster last May.  At this time, the patient is stable for discharge home.  I counseled her on the results of the workup and plan of care; I gave her strict return precautions and she expressed understanding.   FINAL CLINICAL IMPRESSION(S) / ED DIAGNOSES   Final diagnoses:  Closed fracture of right orbital floor, initial encounter (HCC)  Contusion of face, initial encounter     Rx / DC Orders   ED Discharge Orders          Ordered    cephALEXin (KEFLEX) 500 MG capsule  4 times daily        09/14/23 1926             Note:  This document was prepared using Dragon voice recognition software and may include unintentional dictation errors.    [name], MD 09/14/23 1930

## 2023-09-14 NOTE — ED Triage Notes (Signed)
Pt ambulatory to triage with a cane.  Pt tripped and fell on the sidewalk.  Pt has bruising to right eye, and bleeding from right nares.    No loc.  No n/v/d.  Denies neck or back pain.  Pt has left wrist pain.  Swelling noted.   Pt has right knee pain. Pt alert  speech clear.

## 2023-09-14 NOTE — ED Notes (Signed)
See triage note  Presents s/p fall today  States she landed on cement  Bruising noted to right forearm ,right side of face  and left wrist  No deformity noted  Good pulses

## 2023-09-14 NOTE — Discharge Instructions (Signed)
Take the antibiotic as prescribed.  Avoid blowing your nose.  Use the Afrin nasal spray up to every 12 hours for the next 3 days.  Follow-up with your eye doctor next week.  Return to the ER for new, worsening, or persistent severe headache, eye pain, blurred vision or double vision, nosebleed, or any other new or worsening symptoms that concern you.

## 2023-09-18 DIAGNOSIS — S0231XA Fracture of orbital floor, right side, initial encounter for closed fracture: Secondary | ICD-10-CM | POA: Diagnosis not present

## 2023-09-18 DIAGNOSIS — H43813 Vitreous degeneration, bilateral: Secondary | ICD-10-CM | POA: Diagnosis not present

## 2023-10-15 DIAGNOSIS — J019 Acute sinusitis, unspecified: Secondary | ICD-10-CM | POA: Diagnosis not present

## 2023-10-15 DIAGNOSIS — J441 Chronic obstructive pulmonary disease with (acute) exacerbation: Secondary | ICD-10-CM | POA: Diagnosis not present

## 2023-10-24 DIAGNOSIS — M546 Pain in thoracic spine: Secondary | ICD-10-CM | POA: Diagnosis not present

## 2023-10-24 DIAGNOSIS — R2681 Unsteadiness on feet: Secondary | ICD-10-CM | POA: Diagnosis not present

## 2023-10-24 DIAGNOSIS — M545 Low back pain, unspecified: Secondary | ICD-10-CM | POA: Diagnosis not present

## 2023-10-26 DIAGNOSIS — E119 Type 2 diabetes mellitus without complications: Secondary | ICD-10-CM | POA: Diagnosis not present

## 2023-10-26 DIAGNOSIS — Z961 Presence of intraocular lens: Secondary | ICD-10-CM | POA: Diagnosis not present

## 2023-10-26 DIAGNOSIS — S0231XA Fracture of orbital floor, right side, initial encounter for closed fracture: Secondary | ICD-10-CM | POA: Diagnosis not present

## 2023-10-26 DIAGNOSIS — H353131 Nonexudative age-related macular degeneration, bilateral, early dry stage: Secondary | ICD-10-CM | POA: Diagnosis not present

## 2023-10-27 DIAGNOSIS — R0602 Shortness of breath: Secondary | ICD-10-CM | POA: Diagnosis not present

## 2023-10-27 DIAGNOSIS — G4733 Obstructive sleep apnea (adult) (pediatric): Secondary | ICD-10-CM | POA: Diagnosis not present

## 2023-10-27 DIAGNOSIS — J4521 Mild intermittent asthma with (acute) exacerbation: Secondary | ICD-10-CM | POA: Diagnosis not present

## 2023-10-31 DIAGNOSIS — R2681 Unsteadiness on feet: Secondary | ICD-10-CM | POA: Diagnosis not present

## 2023-10-31 DIAGNOSIS — M545 Low back pain, unspecified: Secondary | ICD-10-CM | POA: Diagnosis not present

## 2023-10-31 DIAGNOSIS — M546 Pain in thoracic spine: Secondary | ICD-10-CM | POA: Diagnosis not present

## 2023-11-02 DIAGNOSIS — M546 Pain in thoracic spine: Secondary | ICD-10-CM | POA: Diagnosis not present

## 2023-11-02 DIAGNOSIS — R2681 Unsteadiness on feet: Secondary | ICD-10-CM | POA: Diagnosis not present

## 2023-11-02 DIAGNOSIS — M545 Low back pain, unspecified: Secondary | ICD-10-CM | POA: Diagnosis not present

## 2023-11-06 DIAGNOSIS — D509 Iron deficiency anemia, unspecified: Secondary | ICD-10-CM | POA: Diagnosis not present

## 2023-11-06 DIAGNOSIS — E7849 Other hyperlipidemia: Secondary | ICD-10-CM | POA: Diagnosis not present

## 2023-11-06 DIAGNOSIS — E538 Deficiency of other specified B group vitamins: Secondary | ICD-10-CM | POA: Diagnosis not present

## 2023-11-06 DIAGNOSIS — E1165 Type 2 diabetes mellitus with hyperglycemia: Secondary | ICD-10-CM | POA: Diagnosis not present

## 2023-11-06 DIAGNOSIS — Z794 Long term (current) use of insulin: Secondary | ICD-10-CM | POA: Diagnosis not present

## 2023-11-06 DIAGNOSIS — I10 Essential (primary) hypertension: Secondary | ICD-10-CM | POA: Diagnosis not present

## 2023-11-09 DIAGNOSIS — M546 Pain in thoracic spine: Secondary | ICD-10-CM | POA: Diagnosis not present

## 2023-11-09 DIAGNOSIS — M545 Low back pain, unspecified: Secondary | ICD-10-CM | POA: Diagnosis not present

## 2023-11-09 DIAGNOSIS — R2681 Unsteadiness on feet: Secondary | ICD-10-CM | POA: Diagnosis not present

## 2023-11-13 DIAGNOSIS — E7849 Other hyperlipidemia: Secondary | ICD-10-CM | POA: Diagnosis not present

## 2023-11-13 DIAGNOSIS — Z794 Long term (current) use of insulin: Secondary | ICD-10-CM | POA: Diagnosis not present

## 2023-11-13 DIAGNOSIS — I351 Nonrheumatic aortic (valve) insufficiency: Secondary | ICD-10-CM | POA: Diagnosis not present

## 2023-11-13 DIAGNOSIS — I10 Essential (primary) hypertension: Secondary | ICD-10-CM | POA: Diagnosis not present

## 2023-11-13 DIAGNOSIS — I700 Atherosclerosis of aorta: Secondary | ICD-10-CM | POA: Diagnosis not present

## 2023-11-13 DIAGNOSIS — G4733 Obstructive sleep apnea (adult) (pediatric): Secondary | ICD-10-CM | POA: Diagnosis not present

## 2023-11-13 DIAGNOSIS — G319 Degenerative disease of nervous system, unspecified: Secondary | ICD-10-CM | POA: Diagnosis not present

## 2023-11-13 DIAGNOSIS — E785 Hyperlipidemia, unspecified: Secondary | ICD-10-CM | POA: Diagnosis not present

## 2023-11-13 DIAGNOSIS — F3342 Major depressive disorder, recurrent, in full remission: Secondary | ICD-10-CM | POA: Diagnosis not present

## 2023-11-13 DIAGNOSIS — M791 Myalgia, unspecified site: Secondary | ICD-10-CM | POA: Diagnosis not present

## 2023-11-13 DIAGNOSIS — K219 Gastro-esophageal reflux disease without esophagitis: Secondary | ICD-10-CM | POA: Diagnosis not present

## 2023-11-13 DIAGNOSIS — J438 Other emphysema: Secondary | ICD-10-CM | POA: Diagnosis not present

## 2023-11-13 DIAGNOSIS — E669 Obesity, unspecified: Secondary | ICD-10-CM | POA: Diagnosis not present

## 2023-11-13 DIAGNOSIS — E1169 Type 2 diabetes mellitus with other specified complication: Secondary | ICD-10-CM | POA: Diagnosis not present

## 2023-11-13 DIAGNOSIS — E538 Deficiency of other specified B group vitamins: Secondary | ICD-10-CM | POA: Diagnosis not present

## 2023-11-13 DIAGNOSIS — E1159 Type 2 diabetes mellitus with other circulatory complications: Secondary | ICD-10-CM | POA: Diagnosis not present

## 2023-11-13 DIAGNOSIS — E1165 Type 2 diabetes mellitus with hyperglycemia: Secondary | ICD-10-CM | POA: Diagnosis not present

## 2023-11-14 DIAGNOSIS — M545 Low back pain, unspecified: Secondary | ICD-10-CM | POA: Diagnosis not present

## 2023-11-14 DIAGNOSIS — M546 Pain in thoracic spine: Secondary | ICD-10-CM | POA: Diagnosis not present

## 2023-11-14 DIAGNOSIS — R2681 Unsteadiness on feet: Secondary | ICD-10-CM | POA: Diagnosis not present

## 2023-11-16 DIAGNOSIS — R2681 Unsteadiness on feet: Secondary | ICD-10-CM | POA: Diagnosis not present

## 2023-11-16 DIAGNOSIS — M546 Pain in thoracic spine: Secondary | ICD-10-CM | POA: Diagnosis not present

## 2023-11-16 DIAGNOSIS — M545 Low back pain, unspecified: Secondary | ICD-10-CM | POA: Diagnosis not present

## 2023-11-21 DIAGNOSIS — M545 Low back pain, unspecified: Secondary | ICD-10-CM | POA: Diagnosis not present

## 2023-11-21 DIAGNOSIS — R2681 Unsteadiness on feet: Secondary | ICD-10-CM | POA: Diagnosis not present

## 2023-11-21 DIAGNOSIS — M546 Pain in thoracic spine: Secondary | ICD-10-CM | POA: Diagnosis not present

## 2023-11-23 DIAGNOSIS — R2681 Unsteadiness on feet: Secondary | ICD-10-CM | POA: Diagnosis not present

## 2023-11-23 DIAGNOSIS — M546 Pain in thoracic spine: Secondary | ICD-10-CM | POA: Diagnosis not present

## 2023-11-23 DIAGNOSIS — M545 Low back pain, unspecified: Secondary | ICD-10-CM | POA: Diagnosis not present

## 2023-11-28 DIAGNOSIS — R2681 Unsteadiness on feet: Secondary | ICD-10-CM | POA: Diagnosis not present

## 2023-11-28 DIAGNOSIS — M546 Pain in thoracic spine: Secondary | ICD-10-CM | POA: Diagnosis not present

## 2023-11-28 DIAGNOSIS — M545 Low back pain, unspecified: Secondary | ICD-10-CM | POA: Diagnosis not present

## 2023-11-30 DIAGNOSIS — R2681 Unsteadiness on feet: Secondary | ICD-10-CM | POA: Diagnosis not present

## 2023-11-30 DIAGNOSIS — M546 Pain in thoracic spine: Secondary | ICD-10-CM | POA: Diagnosis not present

## 2023-11-30 DIAGNOSIS — M545 Low back pain, unspecified: Secondary | ICD-10-CM | POA: Diagnosis not present

## 2023-12-07 DIAGNOSIS — M546 Pain in thoracic spine: Secondary | ICD-10-CM | POA: Diagnosis not present

## 2023-12-07 DIAGNOSIS — M545 Low back pain, unspecified: Secondary | ICD-10-CM | POA: Diagnosis not present

## 2023-12-07 DIAGNOSIS — R2681 Unsteadiness on feet: Secondary | ICD-10-CM | POA: Diagnosis not present

## 2023-12-12 DIAGNOSIS — G4733 Obstructive sleep apnea (adult) (pediatric): Secondary | ICD-10-CM | POA: Diagnosis not present

## 2023-12-12 DIAGNOSIS — R918 Other nonspecific abnormal finding of lung field: Secondary | ICD-10-CM | POA: Diagnosis not present

## 2023-12-12 DIAGNOSIS — J45909 Unspecified asthma, uncomplicated: Secondary | ICD-10-CM | POA: Diagnosis not present

## 2023-12-19 DIAGNOSIS — M546 Pain in thoracic spine: Secondary | ICD-10-CM | POA: Diagnosis not present

## 2023-12-19 DIAGNOSIS — R2681 Unsteadiness on feet: Secondary | ICD-10-CM | POA: Diagnosis not present

## 2023-12-19 DIAGNOSIS — M545 Low back pain, unspecified: Secondary | ICD-10-CM | POA: Diagnosis not present

## 2023-12-21 DIAGNOSIS — M545 Low back pain, unspecified: Secondary | ICD-10-CM | POA: Diagnosis not present

## 2023-12-21 DIAGNOSIS — R2681 Unsteadiness on feet: Secondary | ICD-10-CM | POA: Diagnosis not present

## 2023-12-21 DIAGNOSIS — M546 Pain in thoracic spine: Secondary | ICD-10-CM | POA: Diagnosis not present

## 2023-12-26 DIAGNOSIS — M546 Pain in thoracic spine: Secondary | ICD-10-CM | POA: Diagnosis not present

## 2023-12-26 DIAGNOSIS — R2681 Unsteadiness on feet: Secondary | ICD-10-CM | POA: Diagnosis not present

## 2023-12-26 DIAGNOSIS — M545 Low back pain, unspecified: Secondary | ICD-10-CM | POA: Diagnosis not present

## 2024-01-09 DIAGNOSIS — J9801 Acute bronchospasm: Secondary | ICD-10-CM | POA: Diagnosis not present

## 2024-01-09 DIAGNOSIS — J449 Chronic obstructive pulmonary disease, unspecified: Secondary | ICD-10-CM | POA: Diagnosis not present

## 2024-01-22 DIAGNOSIS — J438 Other emphysema: Secondary | ICD-10-CM | POA: Diagnosis not present

## 2024-01-22 DIAGNOSIS — R0982 Postnasal drip: Secondary | ICD-10-CM | POA: Diagnosis not present

## 2024-01-22 DIAGNOSIS — G4733 Obstructive sleep apnea (adult) (pediatric): Secondary | ICD-10-CM | POA: Diagnosis not present

## 2024-01-22 DIAGNOSIS — R053 Chronic cough: Secondary | ICD-10-CM | POA: Diagnosis not present

## 2024-01-31 DIAGNOSIS — Z1331 Encounter for screening for depression: Secondary | ICD-10-CM | POA: Diagnosis not present

## 2024-01-31 DIAGNOSIS — K219 Gastro-esophageal reflux disease without esophagitis: Secondary | ICD-10-CM | POA: Diagnosis not present

## 2024-01-31 DIAGNOSIS — J301 Allergic rhinitis due to pollen: Secondary | ICD-10-CM | POA: Diagnosis not present

## 2024-01-31 DIAGNOSIS — J4520 Mild intermittent asthma, uncomplicated: Secondary | ICD-10-CM | POA: Diagnosis not present

## 2024-04-01 DIAGNOSIS — J301 Allergic rhinitis due to pollen: Secondary | ICD-10-CM | POA: Diagnosis not present

## 2024-04-01 DIAGNOSIS — J4520 Mild intermittent asthma, uncomplicated: Secondary | ICD-10-CM | POA: Diagnosis not present

## 2024-05-01 DIAGNOSIS — E113293 Type 2 diabetes mellitus with mild nonproliferative diabetic retinopathy without macular edema, bilateral: Secondary | ICD-10-CM | POA: Diagnosis not present

## 2024-05-01 DIAGNOSIS — Z961 Presence of intraocular lens: Secondary | ICD-10-CM | POA: Diagnosis not present

## 2024-05-01 DIAGNOSIS — H353132 Nonexudative age-related macular degeneration, bilateral, intermediate dry stage: Secondary | ICD-10-CM | POA: Diagnosis not present

## 2024-05-08 ENCOUNTER — Other Ambulatory Visit: Payer: Self-pay | Admitting: Internal Medicine

## 2024-05-08 DIAGNOSIS — Z1231 Encounter for screening mammogram for malignant neoplasm of breast: Secondary | ICD-10-CM

## 2024-05-13 DIAGNOSIS — E1169 Type 2 diabetes mellitus with other specified complication: Secondary | ICD-10-CM | POA: Diagnosis not present

## 2024-05-13 DIAGNOSIS — Z794 Long term (current) use of insulin: Secondary | ICD-10-CM | POA: Diagnosis not present

## 2024-05-13 DIAGNOSIS — E669 Obesity, unspecified: Secondary | ICD-10-CM | POA: Diagnosis not present

## 2024-05-13 DIAGNOSIS — E1165 Type 2 diabetes mellitus with hyperglycemia: Secondary | ICD-10-CM | POA: Diagnosis not present

## 2024-05-13 DIAGNOSIS — G4733 Obstructive sleep apnea (adult) (pediatric): Secondary | ICD-10-CM | POA: Diagnosis not present

## 2024-05-13 DIAGNOSIS — E7849 Other hyperlipidemia: Secondary | ICD-10-CM | POA: Diagnosis not present

## 2024-05-13 DIAGNOSIS — D72810 Lymphocytopenia: Secondary | ICD-10-CM | POA: Diagnosis not present

## 2024-05-20 DIAGNOSIS — E785 Hyperlipidemia, unspecified: Secondary | ICD-10-CM | POA: Diagnosis not present

## 2024-05-20 DIAGNOSIS — E1169 Type 2 diabetes mellitus with other specified complication: Secondary | ICD-10-CM | POA: Diagnosis not present

## 2024-05-20 DIAGNOSIS — E669 Obesity, unspecified: Secondary | ICD-10-CM | POA: Diagnosis not present

## 2024-05-22 DIAGNOSIS — D509 Iron deficiency anemia, unspecified: Secondary | ICD-10-CM | POA: Diagnosis not present

## 2024-05-22 DIAGNOSIS — F331 Major depressive disorder, recurrent, moderate: Secondary | ICD-10-CM | POA: Diagnosis not present

## 2024-05-22 DIAGNOSIS — E538 Deficiency of other specified B group vitamins: Secondary | ICD-10-CM | POA: Diagnosis not present

## 2024-05-22 DIAGNOSIS — G4733 Obstructive sleep apnea (adult) (pediatric): Secondary | ICD-10-CM | POA: Diagnosis not present

## 2024-05-22 DIAGNOSIS — I700 Atherosclerosis of aorta: Secondary | ICD-10-CM | POA: Diagnosis not present

## 2024-05-22 DIAGNOSIS — G319 Degenerative disease of nervous system, unspecified: Secondary | ICD-10-CM | POA: Diagnosis not present

## 2024-05-22 DIAGNOSIS — E7849 Other hyperlipidemia: Secondary | ICD-10-CM | POA: Diagnosis not present

## 2024-05-22 DIAGNOSIS — Z23 Encounter for immunization: Secondary | ICD-10-CM | POA: Diagnosis not present

## 2024-05-22 DIAGNOSIS — Z1331 Encounter for screening for depression: Secondary | ICD-10-CM | POA: Diagnosis not present

## 2024-05-22 DIAGNOSIS — I10 Essential (primary) hypertension: Secondary | ICD-10-CM | POA: Diagnosis not present

## 2024-05-22 DIAGNOSIS — J438 Other emphysema: Secondary | ICD-10-CM | POA: Diagnosis not present

## 2024-05-22 DIAGNOSIS — E1165 Type 2 diabetes mellitus with hyperglycemia: Secondary | ICD-10-CM | POA: Diagnosis not present

## 2024-05-22 DIAGNOSIS — Z0000 Encounter for general adult medical examination without abnormal findings: Secondary | ICD-10-CM | POA: Diagnosis not present

## 2024-05-28 ENCOUNTER — Ambulatory Visit
Admission: RE | Admit: 2024-05-28 | Discharge: 2024-05-28 | Disposition: A | Discharge status: Home / Self Care | Source: Ambulatory Visit | Attending: Internal Medicine | Admitting: Internal Medicine

## 2024-05-28 DIAGNOSIS — Z1231 Encounter for screening mammogram for malignant neoplasm of breast: Secondary | ICD-10-CM | POA: Insufficient documentation

## 2024-06-03 DIAGNOSIS — J4520 Mild intermittent asthma, uncomplicated: Secondary | ICD-10-CM | POA: Diagnosis not present

## 2024-06-03 DIAGNOSIS — M810 Age-related osteoporosis without current pathological fracture: Secondary | ICD-10-CM | POA: Diagnosis not present

## 2024-06-28 DIAGNOSIS — E1159 Type 2 diabetes mellitus with other circulatory complications: Secondary | ICD-10-CM | POA: Diagnosis not present

## 2024-06-28 DIAGNOSIS — E1169 Type 2 diabetes mellitus with other specified complication: Secondary | ICD-10-CM | POA: Diagnosis not present

## 2024-06-28 DIAGNOSIS — E785 Hyperlipidemia, unspecified: Secondary | ICD-10-CM | POA: Diagnosis not present

## 2024-06-28 DIAGNOSIS — M810 Age-related osteoporosis without current pathological fracture: Secondary | ICD-10-CM | POA: Diagnosis not present

## 2024-08-30 ENCOUNTER — Other Ambulatory Visit: Payer: Self-pay
